# Patient Record
Sex: Female | Born: 1975 | Race: White | Hispanic: No | Marital: Married | Smoking: Former smoker
Health system: Southern US, Community
[De-identification: ages and names within clinical notes are randomized; demographics above are authoritative.]

## PROBLEM LIST (undated history)

## (undated) DIAGNOSIS — E079 Disorder of thyroid, unspecified: Secondary | ICD-10-CM

## (undated) HISTORY — PX: EYE SURGERY: SHX253

## (undated) HISTORY — PX: BREAST BIOPSY: SHX20

---

## 2009-08-29 ENCOUNTER — Ambulatory Visit: Payer: Self-pay | Admitting: Sports Medicine

## 2009-08-29 DIAGNOSIS — R109 Unspecified abdominal pain: Secondary | ICD-10-CM

## 2009-08-29 DIAGNOSIS — M25569 Pain in unspecified knee: Secondary | ICD-10-CM

## 2009-08-29 DIAGNOSIS — R269 Unspecified abnormalities of gait and mobility: Secondary | ICD-10-CM

## 2010-03-05 ENCOUNTER — Ambulatory Visit: Payer: Self-pay | Admitting: Sports Medicine

## 2010-03-05 DIAGNOSIS — M25559 Pain in unspecified hip: Secondary | ICD-10-CM | POA: Insufficient documentation

## 2010-09-18 NOTE — Assessment & Plan Note (Signed)
Summary: RUNNER/GROIN PAIN/MJD   Vital Signs:  Patient profile:   36 year old female BP sitting:   122 / 87  Vitals Entered By: Lillia Pauls CMA (March 05, 2010 11:15 AM)  History of Present Illness: 2 mos ago getting out of car felt pop in LEFT groin since then hurts at times and did a second time w severe pain getting out of bed at that time had some limping took a 2 wk break  running less distance at about 12 min pace spin class OK 6 mi run yesterday slow/ no pain during run/ groin pain worsened p applying ice  last yr had some OP type pain at end of marathon training this type of pain has come back  Allergies: No Known Drug Allergies  Physical Exam  General:  obese alert.   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

## 2010-09-18 NOTE — Assessment & Plan Note (Signed)
Summary: KNEE PROBLEMS WHILE RUNNING,[facility]   Vital Signs:  Patient profile:   35 year old female Height:      65 inches Weight:      165 pounds BMI:     27.56 Temp:     97.8 degrees F Pulse rate:   87 / minute BP sitting:   133 / 87  Vitals Entered By: Lillia Pauls CMA (August 29, 2009 10:07 AM)  History of Present Illness: Pt presents with both pubic pain and left knee pain. She trained for the [employer] marathon which she completed in [place] of 2010. She noticed that when she started to do her long runs that she would have internal pelvic pain. She does not have pain with coughing or sneezing. She denies any prior trauma, surgery or births. She has pain with walking and getting out of the car the day after her long run. She has not tried icing, heat or medications for this. She is able to continue her normal activities without any significant hindrance from her pelvic pain.    She has also started to recently have left anterior knee pain. She describes a feeling in which she wishes that the knee would simply pop to relieve the pressure. The knee pain develops after about 2-3 miles of running. It is worse with going down hills but not painful going up hills. A few hours after she stops her run the pain goes away completely. When she rests she does not have any knee pain. Again, no history of trauma or prior surgeries. The knee does not hurt her as much if she runs on the treadmill.   Allergies (verified): No Known Drug Allergies  Physical Exam  General:  alert and well-developed.   Head:  normocephalic and atraumatic.   Msk:  Left Knee: No bony abnormalities, edema or bruising. Full flexion and extension without pain Laterall patellar tracking No pain along the joint lines or in the popliteal fossa Normal MCL and LCL Negative McMurray's, anterior drawer sign and posterior drawer sign 5/5 strength with resisted knee flexion and extension while seated small VMO  Right Knee: No  bony abnormalities, edema or bruising. Full flexion and extension without pain Laterall patellar tracking No pain along the joint lines or in the popliteal fossa Normal MCL and LCL Negative McMurray's, anterior drawer sign and posterior drawer sign 5/5 strength with resisted knee flexion and extension while seated small VMO  Hips: Weak 4/5 hip flexion and hip abduction against resistance  Gait: Pronates more with her right than her left with walking. However, she has more pronation with jogging in the left foot than in the right foot. 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 Not as significant pes planus of the right foot. She has developing bunions bilaterally.     Impression & Recommendations:  Problem # 1:  KNEE PAIN, LEFT (ICD-719.46) Assessment New  Due to core weakness, pronation during running and lateral tracking patella. 1. Given core strengthening exercises to do daily. 2. Can continue current training but if pain becomes severe will have to modify activities. 3. Given sports insoles today to help correct pes planus and genus valgum. 4. Return in 3-4 weels for follow-up.   Orders: Sports Insoles [phone])  Problem # 2:  PELVIC  PAIN (ICD-789.09) Assessment: New Appears to be due to weakness of hip stabilizers. 1. Given a handout with hip strengthening exercises to do daily. 2. Can take OTC NSAID as  desired.  Problem # 3:  GAIT DISTURBANCE (ICD-781.2) Assessment: New  Due to pes planus and pronation. 1. Given sports insoles with better arch support. She is to use these especially with running and if she is going to be on her feet for long periods of time. 2. Consider custom orthotics in the future as she is also developing bilateral bunions and has a gait disturbance.  Orders: Sports Insoles [phone])

## 2012-12-31 ENCOUNTER — Emergency Department
Admission: EM | Admit: 2012-12-31 | Discharge: 2012-12-31 | Disposition: A | Discharge status: Home / Self Care | Payer: 59 | Source: Home / Self Care | Attending: Family Medicine | Admitting: Family Medicine

## 2012-12-31 ENCOUNTER — Encounter: Payer: Self-pay | Admitting: *Deleted

## 2012-12-31 DIAGNOSIS — N3000 Acute cystitis without hematuria: Secondary | ICD-10-CM

## 2012-12-31 DIAGNOSIS — R358 Other polyuria: Secondary | ICD-10-CM

## 2012-12-31 HISTORY — DX: Disorder of thyroid, unspecified: E07.9

## 2012-12-31 LAB — POCT URINALYSIS DIP (MANUAL ENTRY)
Glucose, UA: NEGATIVE
Ketones, POC UA: NEGATIVE
Spec Grav, UA: 1.02 (ref 1.005–1.03)
Urobilinogen, UA: 0.2 (ref 0–1)

## 2012-12-31 MED ORDER — NITROFURANTOIN MONOHYD MACRO 100 MG PO CAPS: 100.0000 mg | ORAL_CAPSULE | Freq: Two times a day (BID) | ORAL | Status: AC

## 2012-12-31 MED ORDER — FLUCONAZOLE 150 MG PO TABS: 150.0000 mg | ORAL_TABLET | Freq: Once | ORAL | Status: AC

## 2012-12-31 NOTE — ED Provider Notes (Signed)
History     CSN: [id number]  Arrival date & time 12/31/12  1513   First MD Initiated Contact with Patient 12/31/12 1542      Chief Complaint  Patient presents with  . Polyuria       HPI Comments: Patient noticed malodorous urine yesterday.  Later in the day she developed mild urgency that has persisted.  She noticed a brief pink discoloration in her urine today.  Patient's last menstrual period was 12/23/2012.  No fevers, chills, and sweats.  No abdominal or pelvic pain.  Patient is a 37 y.o. female presenting with dysuria. The history is provided by the patient.  Dysuria  This is a new problem. The current episode started yesterday. The problem occurs intermittently. The problem has been gradually worsening. The quality of the pain is described as burning. The patient is experiencing no pain. There has been no fever. Associated symptoms include frequency, hesitancy and urgency. Pertinent negatives include no chills, no sweats, no nausea, no vomiting, no discharge, no hematuria and no flank pain. She has tried nothing for the symptoms.    Past Medical History  Diagnosis Date  . Thyroid disease     Past Surgical History  Procedure Laterality Date  . Eye surgery      Family History  Problem Relation Age of Onset  . Hyperlipidemia Mother   . Hyperlipidemia Father     History  Substance Use Topics  . Smoking status: Former Smoker    Quit date: 12/31/2000  . Smokeless tobacco: Never Used  . Alcohol Use: Yes    OB History   Grav Para Term Preterm Abortions TAB SAB Ect Mult Living                  Review of Systems  Constitutional: Negative for chills.  Gastrointestinal: Negative for nausea and vomiting.  Genitourinary: Positive for dysuria, hesitancy, urgency and frequency. Negative for hematuria and flank pain.  All other systems reviewed and are negative.    Allergies  Bee venom  Home Medications   Current Outpatient Rx  Name  Route  Sig  Dispense  Refill   . levothyroxine (SYNTHROID, LEVOTHROID) 50 MCG tablet   Oral   Take 50 mcg by mouth daily before breakfast.         . nitrofurantoin, macrocrystal-monohydrate, (MACROBID) 100 MG capsule   Oral   Take 1 capsule (100 mg total) by mouth 2 (two) times daily.   14 capsule   0     BP 129/81  Pulse 79  Temp(Src) 98.5 F (36.9 C) (Oral)  Resp 14  Ht 5\' 5"  (1.651 m)  Wt 223 lb (101.152 kg)  BMI 37.11 kg/m2  SpO2 100%  LMP 12/23/2012  Physical Exam Nursing notes and Vital Signs reviewed. Appearance:  Patient appears stated age, and in no acute distress.  Patient is obese (BMI 37.1) Eyes:  Pupils are equal, round, and reactive to light and accomodation.  Extraocular movement is intact.  Conjunctivae are not inflamed  Pharynx:  Normal Neck:  Supple.  No adenopathy Lungs:  Clear to auscultation.  Breath sounds are equal.  Heart:  Regular rate and rhythm without murmurs, rubs, or gallops.  Abdomen:  Mild suprapubic tenderness without masses or hepatosplenomegaly.  Bowel sounds are present.  No CVA or flank tenderness.  Extremities:  No edema.  No calf tenderness Skin:  No rash present.   ED Course  Procedures  none  Labs Reviewed  URINE CULTURE pending  POCT  URINALYSIS DIP (MANUAL ENTRY) BLO small; LEU small      1. Polyuria   2. Acute cystitis       MDM  Urine culture pending.  Begin Macrobid.  Rx written for Diflucan if patient develops candida vaginitis. Increase fluid intake.  May take AZO for 2 days for discomfort, if desired. Followup with Family Doctor if not improved in one week.         Lattie Haw, MD 12/31/12 [phone]

## 2012-12-31 NOTE — ED Notes (Signed)
Olivia Padilla c/o 2 days of polyuria, malodorous urine. This am she reports hematuria. Denis fever or chills.

## 2013-01-01 ENCOUNTER — Telehealth: Payer: Self-pay | Admitting: Emergency Medicine

## 2013-01-02 LAB — URINE CULTURE: Colony Count: >=100000

## 2013-01-03 ENCOUNTER — Telehealth: Payer: Self-pay | Admitting: *Deleted

## 2014-06-02 ENCOUNTER — Other Ambulatory Visit: Payer: Self-pay | Admitting: Internal Medicine

## 2014-06-02 DIAGNOSIS — Z1231 Encounter for screening mammogram for malignant neoplasm of breast: Secondary | ICD-10-CM

## 2014-06-28 ENCOUNTER — Ambulatory Visit
Admission: RE | Admit: 2014-06-28 | Discharge: 2014-06-28 | Disposition: A | Discharge status: Home / Self Care | Payer: 59 | Source: Ambulatory Visit | Attending: Internal Medicine | Admitting: Internal Medicine

## 2014-06-28 DIAGNOSIS — Z1231 Encounter for screening mammogram for malignant neoplasm of breast: Secondary | ICD-10-CM

## 2014-09-18 ENCOUNTER — Encounter (HOSPITAL_COMMUNITY): Payer: Self-pay | Admitting: *Deleted

## 2014-09-18 ENCOUNTER — Emergency Department (HOSPITAL_COMMUNITY)
Admission: EM | Admit: 2014-09-18 | Discharge: 2014-09-18 | Disposition: A | Discharge status: Home / Self Care | Payer: 59 | Source: Home / Self Care | Attending: Family Medicine | Admitting: Family Medicine

## 2014-09-18 DIAGNOSIS — J069 Acute upper respiratory infection, unspecified: Secondary | ICD-10-CM

## 2014-09-18 LAB — POCT RAPID STREP A: Streptococcus, Group A Screen (Direct): NEGATIVE

## 2014-09-18 LAB — POCT INFECTIOUS MONO SCREEN: Mono Screen: NEGATIVE

## 2014-09-18 MED ORDER — IPRATROPIUM BROMIDE 0.06 % NA SOLN: 2.0000 | Freq: Four times a day (QID) | NASAL | Status: AC

## 2014-09-18 NOTE — ED Notes (Signed)
Pt  Has  6  Days  Of    Uri          With  sorethroat   Congested       Earache     And      Drainage      Pt  Saw  Her PCP   sev  Days

## 2014-09-18 NOTE — ED Provider Notes (Signed)
CSN: [id number]     Arrival date & time 09/18/14  0941 History   First MD Initiated Contact with Patient 09/18/14 1011     Chief Complaint  Patient presents with  . URI   (Consider location/radiation/quality/duration/timing/severity/associated sxs/prior Treatment) Patient is a 39 y.o. female presenting with URI. The history is provided by the patient.  URI Presenting symptoms: congestion, cough, fatigue, rhinorrhea and sore throat   Presenting symptoms: no fever   Severity:  Mild Onset quality:  Gradual Duration:  6 days Progression:  Worsening Chronicity:  New (seen by lmd with neg strep and flu test,) Relieved by:  None tried Worsened by:  Nothing tried Ineffective treatments:  None tried Associated symptoms: swollen glands   Risk factors: no sick contacts     Past Medical History  Diagnosis Date  . Thyroid disease    Past Surgical History  Procedure Laterality Date  . Eye surgery     Family History  Problem Relation Age of Onset  . Hyperlipidemia Mother   . Hyperlipidemia Father    History  Substance Use Topics  . Smoking status: Former Smoker    Quit date: 12/31/2000  . Smokeless tobacco: Never Used  . Alcohol Use: Yes   OB History    No data available     Review of Systems  Constitutional: Positive for appetite change and fatigue. Negative for fever.  HENT: Positive for congestion, rhinorrhea and sore throat.   Respiratory: Positive for cough.   Gastrointestinal: Negative.   Genitourinary: Negative.   Skin: Negative for rash.    Allergies  Bee venom  Home Medications   Prior to Admission medications   Medication Sig Start Date End Date Taking? Authorizing Provider  fluconazole (DIFLUCAN) 150 MG tablet Take 1 tablet (150 mg total) by mouth once. Take for symptoms of vaginal yeast infection 12/31/12   Lattie HawStephen A Beese, MD  ipratropium (ATROVENT) 0.06 % nasal spray Place 2 sprays into both nostrils 4 (four) times daily. 09/18/14   Linna HoffJames D Erby Sanderson, MD   levothyroxine (SYNTHROID, LEVOTHROID) 50 MCG tablet Take 50 mcg by mouth daily before breakfast.    Historical Provider, MD  nitrofurantoin, macrocrystal-monohydrate, (MACROBID) 100 MG capsule Take 1 capsule (100 mg total) by mouth 2 (two) times daily. 12/31/12   Lattie HawStephen A Beese, MD   BP 123/79 mmHg  Pulse 75  Temp(Src) 99 F (37.2 C) (Oral)  Resp 20  SpO2 100%  LMP 09/11/2014 Physical Exam  Constitutional: She is oriented to person, place, and time. She appears well-developed and well-nourished.  HENT:  Right Ear: External ear normal.  Left Ear: External ear normal.  Mouth/Throat: Oropharyngeal exudate present.  Eyes: Conjunctivae are normal. Pupils are equal, round, and reactive to light.  Neck: Normal range of motion. Neck supple.  Cardiovascular: Normal heart sounds.   Pulmonary/Chest: Effort normal and breath sounds normal.  Lymphadenopathy:    She has cervical adenopathy.  Neurological: She is alert and oriented to person, place, and time.  Skin: Skin is warm and dry.  Nursing note and vitals reviewed.   ED Course  Procedures (including critical care time) Labs Review Labs Reviewed  POCT RAPID STREP A ([facility]RG CARE ONLY)  POCT INFECTIOUS MONO SCREEN   Strep and mono both neg. Imaging Review No results found.   MDM   1. URI (upper respiratory infection)        Linna HoffJames D Jebadiah Imperato, MD 09/18/14 1037

## 2014-09-18 NOTE — Discharge Instructions (Signed)
Drink plenty of fluids as discussed, use medicine as prescribed, and mucinex or delsym for cough. Return or see your doctor if further problems °

## 2014-09-20 LAB — CULTURE, GROUP A STREP

## 2015-08-21 MED FILL — LEVOTHYROXINE 50 MCG TABLET: 50 | 28 days supply | Qty: 36 | Fill #8

## 2015-09-18 MED FILL — LEVOTHYROXINE 50 MCG TABLET: 50 | 28 days supply | Qty: 36 | Fill #9

## 2015-10-16 MED FILL — LEVOTHYROXINE 50 MCG TABLET: 50 | 90 days supply | Qty: 120 | Fill #0

## 2016-01-16 MED FILL — LEVOTHYROXINE 50 MCG TABLET: 50 | 90 days supply | Qty: 120 | Fill #1

## 2016-04-15 MED FILL — LEVOTHYROXINE 50 MCG TABLET: 50 | 90 days supply | Qty: 120 | Fill #2

## 2016-06-07 DIAGNOSIS — E038 Other specified hypothyroidism: Secondary | ICD-10-CM | POA: Diagnosis not present

## 2016-06-07 DIAGNOSIS — Z0000 Encounter for general adult medical examination without abnormal findings: Secondary | ICD-10-CM | POA: Diagnosis not present

## 2016-06-07 DIAGNOSIS — E559 Vitamin D deficiency, unspecified: Secondary | ICD-10-CM | POA: Diagnosis not present

## 2016-06-14 DIAGNOSIS — E038 Other specified hypothyroidism: Secondary | ICD-10-CM | POA: Diagnosis not present

## 2016-06-14 DIAGNOSIS — Z0000 Encounter for general adult medical examination without abnormal findings: Secondary | ICD-10-CM | POA: Diagnosis not present

## 2016-06-14 DIAGNOSIS — E559 Vitamin D deficiency, unspecified: Secondary | ICD-10-CM | POA: Diagnosis not present

## 2016-06-14 DIAGNOSIS — Z1389 Encounter for screening for other disorder: Secondary | ICD-10-CM | POA: Diagnosis not present

## 2016-06-14 DIAGNOSIS — Z6826 Body mass index (BMI) 26.0-26.9, adult: Secondary | ICD-10-CM | POA: Diagnosis not present

## 2016-07-22 MED FILL — LEVOTHYROXINE 50 MCG TABLET: 50 | 90 days supply | Qty: 120 | Fill #0

## 2016-08-09 DIAGNOSIS — M84361A Stress fracture, right tibia, initial encounter for fracture: Secondary | ICD-10-CM | POA: Diagnosis not present

## 2016-10-21 MED FILL — LEVOTHYROXINE 50 MCG TABLET: 50 | 90 days supply | Qty: 120 | Fill #1

## 2017-01-16 MED FILL — LEVOTHYROXINE 50 MCG TABLET: 50 | 90 days supply | Qty: 120 | Fill #2

## 2017-04-17 MED FILL — LEVOTHYROXINE 50 MCG TABLET: 50 | 90 days supply | Qty: 120 | Fill #0

## 2017-06-03 DIAGNOSIS — H5213 Myopia, bilateral: Secondary | ICD-10-CM | POA: Diagnosis not present

## 2017-06-17 DIAGNOSIS — Z0000 Encounter for general adult medical examination without abnormal findings: Secondary | ICD-10-CM | POA: Diagnosis not present

## 2017-06-17 DIAGNOSIS — E559 Vitamin D deficiency, unspecified: Secondary | ICD-10-CM | POA: Diagnosis not present

## 2017-06-17 DIAGNOSIS — E038 Other specified hypothyroidism: Secondary | ICD-10-CM | POA: Diagnosis not present

## 2017-06-17 DIAGNOSIS — R82998 Other abnormal findings in urine: Secondary | ICD-10-CM | POA: Diagnosis not present

## 2017-06-24 ENCOUNTER — Other Ambulatory Visit: Payer: Self-pay | Admitting: Internal Medicine

## 2017-06-24 DIAGNOSIS — Z1231 Encounter for screening mammogram for malignant neoplasm of breast: Secondary | ICD-10-CM

## 2017-06-24 DIAGNOSIS — Z1389 Encounter for screening for other disorder: Secondary | ICD-10-CM | POA: Diagnosis not present

## 2017-06-24 DIAGNOSIS — E559 Vitamin D deficiency, unspecified: Secondary | ICD-10-CM | POA: Diagnosis not present

## 2017-06-24 DIAGNOSIS — E038 Other specified hypothyroidism: Secondary | ICD-10-CM | POA: Diagnosis not present

## 2017-06-24 DIAGNOSIS — Z0000 Encounter for general adult medical examination without abnormal findings: Secondary | ICD-10-CM | POA: Diagnosis not present

## 2017-06-24 DIAGNOSIS — Z6827 Body mass index (BMI) 27.0-27.9, adult: Secondary | ICD-10-CM | POA: Diagnosis not present

## 2017-07-17 ENCOUNTER — Ambulatory Visit
Admission: RE | Admit: 2017-07-17 | Discharge: 2017-07-17 | Disposition: A | Discharge status: Home / Self Care | Payer: 59 | Source: Ambulatory Visit | Attending: Internal Medicine | Admitting: Internal Medicine

## 2017-07-17 DIAGNOSIS — Z1231 Encounter for screening mammogram for malignant neoplasm of breast: Secondary | ICD-10-CM

## 2017-07-21 ENCOUNTER — Other Ambulatory Visit: Payer: Self-pay | Admitting: Internal Medicine

## 2017-07-21 DIAGNOSIS — R928 Other abnormal and inconclusive findings on diagnostic imaging of breast: Secondary | ICD-10-CM

## 2017-07-25 ENCOUNTER — Ambulatory Visit
Admission: RE | Admit: 2017-07-25 | Discharge: 2017-07-25 | Disposition: A | Discharge status: Home / Self Care | Payer: 59 | Source: Ambulatory Visit | Attending: Internal Medicine | Admitting: Internal Medicine

## 2017-07-25 ENCOUNTER — Ambulatory Visit: Admission: RE | Admit: 2017-07-25 | Payer: 59 | Source: Ambulatory Visit

## 2017-07-25 DIAGNOSIS — R928 Other abnormal and inconclusive findings on diagnostic imaging of breast: Secondary | ICD-10-CM

## 2017-07-25 DIAGNOSIS — R922 Inconclusive mammogram: Secondary | ICD-10-CM | POA: Diagnosis not present

## 2017-07-25 MED FILL — LEVOTHYROXINE 50 MCG TABLET: 50 | 90 days supply | Qty: 120 | Fill #1

## 2017-09-05 DIAGNOSIS — S63641A Sprain of metacarpophalangeal joint of right thumb, initial encounter: Secondary | ICD-10-CM | POA: Diagnosis not present

## 2017-09-05 DIAGNOSIS — M25531 Pain in right wrist: Secondary | ICD-10-CM | POA: Diagnosis not present

## 2017-09-09 ENCOUNTER — Other Ambulatory Visit (HOSPITAL_COMMUNITY): Payer: Self-pay | Admitting: Orthopedic Surgery

## 2017-09-09 DIAGNOSIS — S63641A Sprain of metacarpophalangeal joint of right thumb, initial encounter: Secondary | ICD-10-CM

## 2017-09-19 ENCOUNTER — Ambulatory Visit (HOSPITAL_COMMUNITY)
Admission: RE | Admit: 2017-09-19 | Discharge: 2017-09-19 | Disposition: A | Discharge status: Home / Self Care | Payer: 59 | Source: Ambulatory Visit | Attending: Orthopedic Surgery | Admitting: Orthopedic Surgery

## 2017-09-19 DIAGNOSIS — S63641A Sprain of metacarpophalangeal joint of right thumb, initial encounter: Secondary | ICD-10-CM

## 2017-09-19 DIAGNOSIS — X58XXXA Exposure to other specified factors, initial encounter: Secondary | ICD-10-CM | POA: Insufficient documentation

## 2017-09-19 DIAGNOSIS — M79641 Pain in right hand: Secondary | ICD-10-CM | POA: Diagnosis not present

## 2017-09-19 DIAGNOSIS — S6991XA Unspecified injury of right wrist, hand and finger(s), initial encounter: Secondary | ICD-10-CM | POA: Diagnosis not present

## 2017-09-19 DIAGNOSIS — M79644 Pain in right finger(s): Secondary | ICD-10-CM | POA: Diagnosis not present

## 2017-09-19 MED ORDER — GADOBENATE DIMEGLUMINE 529 MG/ML IV SOLN
5.0000 mL | Freq: Once | INTRAVENOUS | Status: AC | PRN
  Administered 2017-09-19: 0.5 mL via INTRA_ARTICULAR

## 2017-09-19 MED ORDER — IOPAMIDOL (ISOVUE-M 200) INJECTION 41%
20.0000 mL | Freq: Once | INTRAMUSCULAR | Status: AC
  Administered 2017-09-19: 20 mL via INTRA_ARTICULAR

## 2017-09-19 MED ORDER — LIDOCAINE HCL (PF) 1 % IJ SOLN
10.0000 mL | Freq: Once | INTRAMUSCULAR | Status: AC
  Administered 2017-09-19: 10 mL

## 2017-09-26 DIAGNOSIS — S63641A Sprain of metacarpophalangeal joint of right thumb, initial encounter: Secondary | ICD-10-CM | POA: Diagnosis not present

## 2017-10-17 DIAGNOSIS — S63641D Sprain of metacarpophalangeal joint of right thumb, subsequent encounter: Secondary | ICD-10-CM | POA: Diagnosis not present

## 2017-11-07 DIAGNOSIS — S63641D Sprain of metacarpophalangeal joint of right thumb, subsequent encounter: Secondary | ICD-10-CM | POA: Diagnosis not present

## 2017-12-13 ENCOUNTER — Other Ambulatory Visit: Payer: Self-pay

## 2017-12-13 ENCOUNTER — Emergency Department (INDEPENDENT_AMBULATORY_CARE_PROVIDER_SITE_OTHER): Payer: 59

## 2017-12-13 ENCOUNTER — Encounter: Payer: Self-pay | Admitting: Emergency Medicine

## 2017-12-13 ENCOUNTER — Emergency Department (INDEPENDENT_AMBULATORY_CARE_PROVIDER_SITE_OTHER)
Admission: EM | Admit: 2017-12-13 | Discharge: 2017-12-13 | Disposition: A | Discharge status: Home / Self Care | Payer: 59 | Source: Home / Self Care | Attending: Family Medicine | Admitting: Family Medicine

## 2017-12-13 DIAGNOSIS — R0781 Pleurodynia: Secondary | ICD-10-CM

## 2017-12-13 DIAGNOSIS — M546 Pain in thoracic spine: Secondary | ICD-10-CM | POA: Diagnosis not present

## 2017-12-13 DIAGNOSIS — W19XXXA Unspecified fall, initial encounter: Secondary | ICD-10-CM

## 2017-12-13 DIAGNOSIS — S2232XA Fracture of one rib, left side, initial encounter for closed fracture: Secondary | ICD-10-CM | POA: Diagnosis not present

## 2017-12-13 DIAGNOSIS — Y9302 Activity, running: Secondary | ICD-10-CM | POA: Diagnosis not present

## 2017-12-13 MED ORDER — TRAMADOL HCL 50 MG PO TABS: 50.0000 mg | ORAL_TABLET | Freq: Four times a day (QID) | ORAL | 0 refills | Status: AC | PRN

## 2017-12-13 MED ORDER — CYCLOBENZAPRINE HCL 10 MG PO TABS: 10.0000 mg | ORAL_TABLET | Freq: Two times a day (BID) | ORAL | 0 refills | Status: DC | PRN

## 2017-12-13 NOTE — Discharge Instructions (Addendum)
°  Tramadol is strong pain medication. While taking, do not drink alcohol, drive, or perform any other activities that requires focus while taking these medications.   Flexeril (cyclobenzaprine) is a muscle relaxer and may cause drowsiness. Do not drink alcohol, drive, or operate heavy machinery while taking.  You may take  acetaminophen every 4-6 hours or in combination with ibuprofen 400-600mg  every 6-8 hours as needed for pain, inflammation, and fever.  Be sure to drink at least eight 8oz glasses of water to stay well hydrated and get at least 8 hours of sleep at night, preferably more while sick.

## 2017-12-13 NOTE — ED Triage Notes (Signed)
Patient was running race today and tripped and fell hard onto left side; painful when any movement. Took ibuprofen  at 1630.

## 2017-12-13 NOTE — ED Provider Notes (Signed)
[facility]    CSN: [id number] Arrival date & time: 12/13/17  1711     History   Chief Complaint Chief Complaint  Patient presents with  . Chest Pain    HPI Olivia Padilla is a 42 y.o. female.   HPI Olivia Padilla is a 42 y.o. female presenting to UC with c/o sudden onset Left side rib pain after she feel hard onto the ground during a trail race this morning.  She took two Advil around 10:30AM so she could finish the race and another  at 16:30 with mild relief.  Pain is worse with deep breath, movement and palpation. Pain is moderate to severe, aching and sharp.  No prior hx of rib fractures.  She reports a scrape to her knee but is not concerned about that.  Denies hitting her head. Denies neck or back pain.   Past Medical History:  Diagnosis Date  . Thyroid disease     Patient Active Problem List   Diagnosis Date Noted  . HIP PAIN, LEFT 03/05/2010  . KNEE PAIN, LEFT 08/29/2009  . GAIT DISTURBANCE 08/29/2009  . PELVIC  PAIN 08/29/2009    Past Surgical History:  Procedure Laterality Date  . BREAST BIOPSY    . EYE SURGERY      OB History   None      Home Medications    Prior to Admission medications   Medication Sig Start Date End Date Taking? Authorizing Provider  cyclobenzaprine (FLEXERIL) 10 MG tablet Take 1 tablet (10 mg total) by mouth 2 (two) times daily as needed. 12/13/17   Lurene Shadow, PA-C  fluconazole (DIFLUCAN) 150 MG tablet Take 1 tablet (150 mg total) by mouth once. Take for symptoms of vaginal yeast infection 12/31/12   Lattie Haw, MD  ipratropium (ATROVENT) 0.06 % nasal spray Place 2 sprays into both nostrils 4 (four) times daily. 09/18/14   Linna Hoff, MD  levothyroxine (SYNTHROID, LEVOTHROID) 50 MCG tablet Take 50 mcg by mouth daily before breakfast.    [provider]  nitrofurantoin, macrocrystal-monohydrate, (MACROBID) 100 MG capsule Take 1 capsule (100 mg total) by mouth 2 (two) times daily. 12/31/12    Lattie Haw, MD  traMADol (ULTRAM) 50 MG tablet Take 1 tablet (50 mg total) by mouth every 6 (six) hours as needed. 12/13/17   Lurene Shadow, PA-C    Family History Family History  Problem Relation Age of Onset  . Hyperlipidemia Mother   . Hyperlipidemia Father   . Breast cancer Paternal Grandmother     Social History Social History   Tobacco Use  . Smoking status: Former Smoker    Last attempt to quit: 12/31/2000    Years since quitting: 16.9  . Smokeless tobacco: Never Used  Substance Use Topics  . Alcohol use: Yes  . Drug use: No     Allergies   Bee venom   Review of Systems Review of Systems  Cardiovascular: Positive for chest pain (Left ribs). Negative for palpitations.  Musculoskeletal: Negative for arthralgias, back pain and myalgias.     Physical Exam Triage Vital Signs ED Triage Vitals  Enc Vitals Group     BP 12/13/17 1723 127/86     Pulse Rate 12/13/17 1723 69     Resp 12/13/17 1723 18     Temp 12/13/17 1723 98.4 F (36.9 C)     Temp Source 12/13/17 1723 Oral     SpO2 12/13/17 1723 99 %  Weight 12/13/17 1724 149 lb (67.6 kg)     Height 12/13/17 1724  (1.626 m)     Head Circumference --      Peak Flow --      Pain Score 12/13/17 1724 8     Pain Loc --      Pain Edu? --      Excl. in GC? --    No data found.  Updated Vital Signs BP 127/86 (BP Location: Right Arm)   Pulse 69   Temp 98.4 F (36.9 C) (Oral)   Resp 18   Ht  (1.626 m)   Wt 149 lb (67.6 kg)   LMP 11/25/2017 (Exact Date)   SpO2 99%   BMI 25.58 kg/m   Visual Acuity Right Eye Distance:   Left Eye Distance:   Bilateral Distance:    Right Eye Near:   Left Eye Near:    Bilateral Near:     Physical Exam  Constitutional: She is oriented to person, place, and time. She appears well-developed and well-nourished.  HENT:  Head: Normocephalic and atraumatic.  Eyes: EOM are normal.  Neck: Normal range of motion.  Cardiovascular: Normal rate.    Pulmonary/Chest: Effort normal. She exhibits tenderness.  Tenderness to Left side ribs, midaxial. No crepitus or deformity.     Musculoskeletal: Normal range of motion.  No midline spinal tenderness. Tenderness to Left mid thoracic muscles. No deformity.  Neurological: She is alert and oriented to person, place, and time.  Skin: Skin is warm and dry.  Chest wall: faint ecchymosis to Left side chest wall over tender ribs. Skin in tact  Psychiatric: She has a normal mood and affect. Her behavior is normal.  Nursing note and vitals reviewed.    UC Treatments / Results  Labs (all labs ordered are listed, but only abnormal results are displayed) Labs Reviewed - No data to display  EKG None Radiology Dg Ribs Unilateral W/chest Left  Result Date: 12/13/2017 CLINICAL DATA:  Patient fell while running injuring left ribs. Pain with movement and inspiration. EXAM: LEFT RIBS AND CHEST - 3+ VIEW COMPARISON:  None. FINDINGS: Acute nondisplaced left anterior eleventh rib fracture is identified. Lungs are clear without pneumothorax or contusion. No effusion. Heart and mediastinal contours are normal. IMPRESSION: Acute nondisplaced left eleventh rib fracture. Electronically Signed   By: Tollie Eth M.D.   On: 12/13/2017 17:57    Procedures Procedures (including critical care time)  Medications Ordered in UC Medications - No data to display   Initial Impression / Assessment and Plan / UC Course  I have reviewed the triage vital signs and the nursing notes.  Pertinent labs & imaging results that were available during my care of the patient were reviewed by me and considered in my medical decision making (see chart for details).     CXR c/w nondisplaced 11th rib fracture Rib belt applied for comfort Home care instructions provided F/u with PCP in 1 week as needed.   Final Clinical Impressions(s) / UC Diagnoses   Final diagnoses:  Rib pain on left side    ED Discharge Orders         Ordered    cyclobenzaprine (FLEXERIL) 10 MG tablet  2 times daily PRN     12/13/17 1759    traMADol (ULTRAM) 50 MG tablet  Every 6 hours PRN     12/13/17 1759       Controlled Substance Prescriptions [place] Controlled Substance Registry consulted? Yes, I have consulted  the [place] Controlled Substances Registry for this patient, and feel the risk/benefit ratio today is favorable for proceeding with this prescription for a controlled substance.   Lurene Shadow, [place] 12/13/17 1806

## 2017-12-19 ENCOUNTER — Telehealth: Payer: Self-pay

## 2017-12-19 ENCOUNTER — Telehealth: Payer: Self-pay | Admitting: Family Medicine

## 2017-12-19 ENCOUNTER — Telehealth: Payer: 59 | Admitting: Family

## 2017-12-19 DIAGNOSIS — M545 Low back pain, unspecified: Secondary | ICD-10-CM

## 2017-12-19 MED ORDER — PREDNISONE 5 MG PO TABS: 5.0000 mg | ORAL_TABLET | ORAL | 0 refills | Status: AC

## 2017-12-19 MED ORDER — CYCLOBENZAPRINE HCL 10 MG PO TABS: 10.0000 mg | ORAL_TABLET | Freq: Three times a day (TID) | ORAL | 0 refills | Status: AC | PRN

## 2017-12-19 NOTE — Telephone Encounter (Signed)
Pt informed tramadol script up is up front. Told to show photo ID upon pickup.

## 2017-12-19 NOTE — Telephone Encounter (Signed)
Refill tramadol  one Q6hr prn #20, no refill

## 2017-12-19 NOTE — Progress Notes (Signed)
Thank you for the details you included in the comment boxes. Those details are very helpful in determining the best course of treatment for you and help [place] to provide the best care. Per [facility] policy, we are unable to give narcotic meds through the E-visit program. Also, as you were treated by a [facility] facility, it is best to reach out to the [facility] (they are open now) and ask them to call in a refill. They are the original prescriber and are able to do that at their discretion. I can refill the Flexeril for sure along with a prednisone pack which will help a lot with the pain/inflammation.   We are sorry that you are not feeling well.  Here is how we plan to help!  Based on what you have shared with me it looks like you mostly have acute back pain.  Acute back pain is defined as musculoskeletal pain that can resolve in 1-3 weeks with conservative treatment.  *See notes above. I am sending a prednisone dose pack along with Flexeril  every 8 hours as needed for muscle spasm.   Some patients experience stomach irritation or in increased heartburn with anti-inflammatory drugs.  Please keep in mind that muscle relaxer's can cause fatigue and should not be taken while at work or driving.  Back pain is very common.  The pain often gets better over time.  The cause of back pain is usually not dangerous.  Most people can learn to manage their back pain on their own.  Home Care  Stay active.  Start with short walks on flat ground if you can.  Try to walk farther each day.  Do not sit, drive or stand in one place for more than 30 minutes.  Do not stay in bed.  Do not avoid exercise or work.  Activity can help your back heal faster.  Be careful when you bend or lift an object.  Bend at your knees, keep the object close to you, and do not twist.  Sleep on a firm mattress.  Lie on your side, and bend your knees.  If you lie on your back, put a pillow under your knees.  Only take medicines as  told by your doctor.  Put ice on the injured area.  Put ice in a plastic bag  Place a towel between your skin and the bag  Leave the ice on for 15-20 minutes, 3-4 times a day for the first 2-3 days. 210 After that, you can switch between ice and heat packs.  Ask your doctor about back exercises or massage.  Avoid feeling anxious or stressed.  Find good ways to deal with stress, such as exercise.  Get Help Right Way If:  Your pain does not go away with rest or medicine.  Your pain does not go away in 1 week.  You have new problems.  You do not feel well.  The pain spreads into your legs.  You cannot control when you poop (bowel movement) or pee (urinate)  You feel sick to your stomach (nauseous) or throw up (vomit)  You have belly (abdominal) pain.  You feel like you may pass out (faint).  If you develop a fever.  Make Sure you:  Understand these instructions.  Will watch your condition  Will get help right away if you are not doing well or get worse.  Your e-visit answers were reviewed by a board certified advanced clinical practitioner to complete your personal care  plan.  Depending on the condition, your plan could have included both over the counter or prescription medications.  If there is a problem please reply  once you have received a response from your provider.  Your safety is important to [place].  If you have drug allergies check your prescription carefully.    You can use MyChart to ask questions about today's visit, request a non-urgent call back, or ask for a work or school excuse for 24 hours related to this e-Visit. If it has been greater than 24 hours you will need to follow up with your provider, or enter a new e-Visit to address those concerns.  You will get an e-mail in the next two days asking about your experience.  I hope that your e-visit has been valuable and will speed your recovery. Thank you for using e-visits.

## 2018-01-06 DIAGNOSIS — Z3202 Encounter for pregnancy test, result negative: Secondary | ICD-10-CM | POA: Diagnosis not present

## 2018-01-06 DIAGNOSIS — N926 Irregular menstruation, unspecified: Secondary | ICD-10-CM | POA: Diagnosis not present

## 2018-01-06 DIAGNOSIS — N951 Menopausal and female climacteric states: Secondary | ICD-10-CM | POA: Diagnosis not present

## 2018-01-06 DIAGNOSIS — G4700 Insomnia, unspecified: Secondary | ICD-10-CM | POA: Diagnosis not present

## 2018-01-06 DIAGNOSIS — N912 Amenorrhea, unspecified: Secondary | ICD-10-CM | POA: Diagnosis not present

## 2018-01-24 ENCOUNTER — Encounter: Payer: Self-pay | Admitting: Emergency Medicine

## 2018-01-24 ENCOUNTER — Emergency Department (INDEPENDENT_AMBULATORY_CARE_PROVIDER_SITE_OTHER)
Admission: EM | Admit: 2018-01-24 | Discharge: 2018-01-24 | Disposition: A | Discharge status: Home / Self Care | Payer: 59 | Source: Home / Self Care | Attending: Family Medicine | Admitting: Family Medicine

## 2018-01-24 ENCOUNTER — Other Ambulatory Visit: Payer: Self-pay

## 2018-01-24 DIAGNOSIS — H9203 Otalgia, bilateral: Secondary | ICD-10-CM | POA: Diagnosis not present

## 2018-01-24 DIAGNOSIS — J029 Acute pharyngitis, unspecified: Secondary | ICD-10-CM

## 2018-01-24 LAB — POCT RAPID STREP A (OFFICE): RAPID STREP A SCREEN: NEGATIVE

## 2018-01-24 NOTE — ED Provider Notes (Signed)
[facility]    CSN: [id number] Arrival date & time: 01/24/18  4782     History   Chief Complaint Chief Complaint  Patient presents with  . Otalgia    bilateral  . Sore Throat    HPI Olivia Padilla is a 42 y.o. female.   Patient noticed some mild soreness in her hard palate 5 days ago that resolved.  Two days ago she developed sore throat and bilateral earache.  No sinus congestion or cough.  No fevers, chills, and sweats.  She feels well otherwise. She states that she had a cold about 3 weeks ago that resolved completely.  The history is provided by the patient.    Past Medical History:  Diagnosis Date  . Thyroid disease     Patient Active Problem List   Diagnosis Date Noted  . HIP PAIN, LEFT 03/05/2010  . KNEE PAIN, LEFT 08/29/2009  . GAIT DISTURBANCE 08/29/2009  . PELVIC  PAIN 08/29/2009    Past Surgical History:  Procedure Laterality Date  . BREAST BIOPSY    . EYE SURGERY      OB History   None      Home Medications    Prior to Admission medications   Medication Sig Start Date End Date Taking? Authorizing Provider  cyclobenzaprine (FLEXERIL) 10 MG tablet Take 1 tablet (10 mg total) by mouth every 8 (eight) hours as needed for muscle spasms. 12/19/17   Withrow, Everardo All, FNP  fluconazole (DIFLUCAN) 150 MG tablet Take 1 tablet (150 mg total) by mouth once. Take for symptoms of vaginal yeast infection 12/31/12   Lattie Haw, MD  ipratropium (ATROVENT) 0.06 % nasal spray Place 2 sprays into both nostrils 4 (four) times daily. 09/18/14   Linna Hoff, MD  levothyroxine (SYNTHROID, LEVOTHROID) 50 MCG tablet Take 50 mcg by mouth daily before breakfast.    [provider]  nitrofurantoin, macrocrystal-monohydrate, (MACROBID) 100 MG capsule Take 1 capsule (100 mg total) by mouth 2 (two) times daily. 12/31/12   Lattie Haw, MD  predniSONE (DELTASONE) 5 MG tablet Take 1 tablet (5 mg total) by mouth as directed. Taper 6,5,4,3,2,1 12/19/17    Withrow, Everardo All, FNP  traMADol (ULTRAM) 50 MG tablet Take 1 tablet (50 mg total) by mouth every 6 (six) hours as needed. 12/13/17   Lurene Shadow, PA-C    Family History Family History  Problem Relation Age of Onset  . Hyperlipidemia Mother   . Hyperlipidemia Father   . Breast cancer Paternal Grandmother     Social History Social History   Tobacco Use  . Smoking status: Former Smoker    Last attempt to quit: 12/31/2000    Years since quitting: 17.0  . Smokeless tobacco: Never Used  Substance Use Topics  . Alcohol use: Yes  . Drug use: No     Allergies   Bee venom   Review of Systems Review of Systems + sore throat No cough No pleuritic pain No wheezing No nasal congestion ? post-nasal drainage No sinus pain/pressure No itchy/red eyes ? earache No hemoptysis No SOB No fever/chills No nausea No vomiting No abdominal pain No diarrhea No urinary symptoms No skin rash No fatigue No myalgias No headache    Physical Exam Triage Vital Signs ED Triage Vitals  Enc Vitals Group     BP 01/24/18 0943 107/72     Pulse Rate 01/24/18 0943 68     Resp 01/24/18 0943 18  Temp 01/24/18 0943 98.7 F (37.1 C)     Temp Source 01/24/18 0943 Oral     SpO2 01/24/18 0943 99 %     Weight 01/24/18 0944 150 lb (68 kg)     Height 01/24/18 0944 5\' 5"  (1.651 m)     Head Circumference --      Peak Flow --      Pain Score 01/24/18 0944 8     Pain Loc --      Pain Edu? --      Excl. in GC? --    No data found.  Updated Vital Signs BP 107/72 (BP Location: Right Arm)   Pulse 68   Temp 98.7 F (37.1 C) (Oral)   Resp 18   Ht 5\' 5"  (1.651 m)   Wt 150 lb (68 kg)   LMP 11/24/2017 (Exact Date) Comment: went to OB/GYN 2 weeks ago; negative HCG  SpO2 99%   BMI 24.96 kg/m   Visual Acuity Right Eye Distance:   Left Eye Distance:   Bilateral Distance:    Right Eye Near:   Left Eye Near:    Bilateral Near:     Physical Exam Nursing notes and Vital Signs  reviewed. Appearance:  Patient appears stated age, and in no acute distress Eyes:  Pupils are equal, round, and reactive to light and accomodation.  Extraocular movement is intact.  Conjunctivae are not inflamed  Ears:  Canals normal.  Tympanic membranes normal.  No TMJ tenderness Nose:  Mildly congested turbinates.  No sinus tenderness.  Pharynx:  Normal Neck:  Supple.  Enlarged posterior/lateral nodes are palpated bilaterally, tender to palpation on the left.   Lungs:  Clear to auscultation.  Breath sounds are equal.  Moving air well. Heart:  Regular rate and rhythm without murmurs, rubs, or gallops.  Abdomen:  Nontender without masses or hepatosplenomegaly.  Bowel sounds are present.  No CVA or flank tenderness.  Extremities:  No edema.  Skin:  No rash present.    UC Treatments / Results  Labs (all labs ordered are listed, but only abnormal results are displayed) Labs Reviewed  STREP A DNA PROBE  POCT RAPID STREP A (OFFICE) negative   Tympanometry:  Right ear tympanogram normal; Left ear tympanogram normal EKG None  Radiology No results found.  Procedures Procedures (including critical care time)  Medications Ordered in UC Medications - No data to display  Initial Impression / Assessment and Plan / UC Course  I have reviewed the triage vital signs and the nursing notes.  Pertinent labs & imaging results that were available during my care of the patient were reviewed by me and considered in my medical decision making (see chart for details).    There is no evidence of bacterial infection today.  Throat culture pending.  Suspect early viral URI. Followup with Family Doctor if not improved in about 10 days.   Final Clinical Impressions(s) / UC Diagnoses   Final diagnoses:  Otalgia of both ears  Acute pharyngitis, unspecified etiology     Discharge Instructions     Try warm salt water gargles for sore throat.  May take Ibuprofen 200mg , 4 tabs every 8 hours with food  for sore throat pain  If increasing cold symptoms develop, try the following:  Take plain guaifenesin (1200mg  extended release tabs such as Mucinex) twice daily, with plenty of water, for cough and congestion.  May add Pseudoephedrine (30mg , one or two every 4 to 6 hours) for sinus congestion.  Get adequate rest.   May use Afrin nasal spray (or generic oxymetazoline) each morning for about 5 days and then discontinue.  Also recommend using saline nasal spray several times daily and saline nasal irrigation (AYR is a common brand).  Use Flonase nasal spray each morning after using Afrin nasal spray and saline nasal irrigation. May take Delsym Cough Suppressant at bedtime for nighttime cough.  Stop all antihistamines for now, and other non-prescription cough/cold preparations.        ED Prescriptions    None        Lattie Haw, MD 01/26/18 [id number]

## 2018-01-24 NOTE — ED Triage Notes (Signed)
Patient had chest cold mid May; resolved then 2 days ago began having sore throat and now bilateral ear pain; no OTCs this morning.

## 2018-01-24 NOTE — Discharge Instructions (Addendum)
Try warm salt water gargles for sore throat.  May take Ibuprofen 200mg , 4 tabs every 8 hours with food for sore throat pain  If increasing cold symptoms develop, try the following:  Take plain guaifenesin (1200mg  extended release tabs such as Mucinex) twice daily, with plenty of water, for cough and congestion.  May add Pseudoephedrine (30mg , one or two every 4 to 6 hours) for sinus congestion.  Get adequate rest.   May use Afrin nasal spray (or generic oxymetazoline) each morning for about 5 days and then discontinue.  Also recommend using saline nasal spray several times daily and saline nasal irrigation (AYR is a common brand).  Use Flonase nasal spray each morning after using Afrin nasal spray and saline nasal irrigation. May take Delsym Cough Suppressant at bedtime for nighttime cough.  Stop all antihistamines for now, and other non-prescription cough/cold preparations.

## 2018-01-26 ENCOUNTER — Telehealth: Payer: Self-pay | Admitting: *Deleted

## 2018-01-26 LAB — STREP A DNA PROBE: GROUP A STREP PROBE: NOT DETECTED

## 2018-01-26 NOTE — Telephone Encounter (Signed)
Callback: No answer. Left message on patient's personal mobile voicemail throat culture is negative. Please call back as needed.

## 2018-02-04 MED FILL — LO LOESTRIN FE 1-10 TABLET: 1 MG-10 MCG | 28 days supply | Qty: 28 | Fill #0

## 2018-03-05 MED FILL — LO LOESTRIN FE 1-10 TABLET: 1 MG-10 MCG | 28 days supply | Qty: 28 | Fill #1

## 2018-03-18 DIAGNOSIS — Z6825 Body mass index (BMI) 25.0-25.9, adult: Secondary | ICD-10-CM | POA: Diagnosis not present

## 2018-03-18 DIAGNOSIS — Z1212 Encounter for screening for malignant neoplasm of rectum: Secondary | ICD-10-CM | POA: Diagnosis not present

## 2018-03-18 DIAGNOSIS — Z01419 Encounter for gynecological examination (general) (routine) without abnormal findings: Secondary | ICD-10-CM | POA: Diagnosis not present

## 2018-04-06 MED FILL — LO LOESTRIN FE 1-10 TABLET: 1 MG-10 MCG | 28 days supply | Qty: 28 | Fill #2

## 2018-05-04 MED FILL — LO LOESTRIN FE 1-10 TABLET: 1 MG-10 MCG | 28 days supply | Qty: 28 | Fill #0

## 2018-06-01 MED FILL — LO LOESTRIN FE 1-10 TABLET: 1 MG-10 MCG | 28 days supply | Qty: 28 | Fill #1

## 2018-06-22 DIAGNOSIS — R82998 Other abnormal findings in urine: Secondary | ICD-10-CM | POA: Diagnosis not present

## 2018-06-22 DIAGNOSIS — E559 Vitamin D deficiency, unspecified: Secondary | ICD-10-CM | POA: Diagnosis not present

## 2018-06-22 DIAGNOSIS — E038 Other specified hypothyroidism: Secondary | ICD-10-CM | POA: Diagnosis not present

## 2018-06-22 DIAGNOSIS — Z0000 Encounter for general adult medical examination without abnormal findings: Secondary | ICD-10-CM | POA: Diagnosis not present

## 2018-06-26 MED FILL — LO LOESTRIN FE 1-10 TABLET: 1 MG-10 MCG | 28 days supply | Qty: 28 | Fill #2

## 2018-06-29 DIAGNOSIS — Z1389 Encounter for screening for other disorder: Secondary | ICD-10-CM | POA: Diagnosis not present

## 2018-06-29 DIAGNOSIS — E038 Other specified hypothyroidism: Secondary | ICD-10-CM | POA: Diagnosis not present

## 2018-06-29 DIAGNOSIS — Z0000 Encounter for general adult medical examination without abnormal findings: Secondary | ICD-10-CM | POA: Diagnosis not present

## 2018-06-29 DIAGNOSIS — Z6825 Body mass index (BMI) 25.0-25.9, adult: Secondary | ICD-10-CM | POA: Diagnosis not present

## 2018-06-29 DIAGNOSIS — E559 Vitamin D deficiency, unspecified: Secondary | ICD-10-CM | POA: Diagnosis not present

## 2018-07-02 ENCOUNTER — Other Ambulatory Visit: Payer: Self-pay | Admitting: Internal Medicine

## 2018-07-02 DIAGNOSIS — Z1231 Encounter for screening mammogram for malignant neoplasm of breast: Secondary | ICD-10-CM

## 2018-07-10 MED FILL — LEVOTHYROXINE 75 MCG TABLET: 75 | 90 days supply | Qty: 90 | Fill #0

## 2018-07-23 MED FILL — LO LOESTRIN FE 1-10 TABLET: 1 MG-10 MCG | 28 days supply | Qty: 28 | Fill #3

## 2018-08-24 MED FILL — LO LOESTRIN FE 1-10 TABLET: 1 MG-10 MCG | 28 days supply | Qty: 28 | Fill #4

## 2018-09-08 ENCOUNTER — Ambulatory Visit
Admission: RE | Admit: 2018-09-08 | Discharge: 2018-09-08 | Disposition: A | Discharge status: Home / Self Care | Payer: 59 | Source: Ambulatory Visit | Attending: Internal Medicine | Admitting: Internal Medicine

## 2018-09-08 DIAGNOSIS — Z1231 Encounter for screening mammogram for malignant neoplasm of breast: Secondary | ICD-10-CM

## 2018-09-21 MED FILL — LO LOESTRIN FE 1-10 TABLET: 1 MG-10 MCG | 28 days supply | Qty: 28 | Fill #5

## 2018-10-15 MED FILL — LO LOESTRIN FE 1-10 TABLET: 1 MG-10 MCG | 28 days supply | Qty: 28 | Fill #6

## 2018-11-03 MED FILL — LEVOTHYROXINE 75 MCG TABLET: 75 | 90 days supply | Qty: 90 | Fill #1

## 2018-11-07 MED FILL — LO LOESTRIN FE 1-10 TABLET: 1 MG-10 MCG | 28 days supply | Qty: 28 | Fill #7 | Status: TO

## 2018-11-10 DIAGNOSIS — M25562 Pain in left knee: Secondary | ICD-10-CM | POA: Diagnosis not present

## 2018-11-19 DIAGNOSIS — M25562 Pain in left knee: Secondary | ICD-10-CM | POA: Diagnosis not present

## 2018-12-07 MED FILL — LO LOESTRIN FE 1-10 TABLET: 1 MG-10 MCG | 28 days supply | Qty: 28 | Fill #0

## 2018-12-30 MED FILL — LO LOESTRIN FE 1-10 TABLET: 1 MG-10 MCG | 28 days supply | Qty: 28 | Fill #1

## 2019-01-06 DIAGNOSIS — M25562 Pain in left knee: Secondary | ICD-10-CM | POA: Diagnosis not present

## 2019-01-06 DIAGNOSIS — M7632 Iliotibial band syndrome, left leg: Secondary | ICD-10-CM | POA: Diagnosis not present

## 2019-01-06 DIAGNOSIS — S76311D Strain of muscle, fascia and tendon of the posterior muscle group at thigh level, right thigh, subsequent encounter: Secondary | ICD-10-CM | POA: Diagnosis not present

## 2019-02-02 MED FILL — LO LOESTRIN FE 1-10 TABLET: 1 MG-10 MCG | 28 days supply | Qty: 28 | Fill #0

## 2019-02-02 MED FILL — LEVOTHYROXINE 75 MCG TABLET: 75 | 90 days supply | Qty: 90 | Fill #2

## 2019-02-18 DIAGNOSIS — M9903 Segmental and somatic dysfunction of lumbar region: Secondary | ICD-10-CM | POA: Diagnosis not present

## 2019-02-18 DIAGNOSIS — M9905 Segmental and somatic dysfunction of pelvic region: Secondary | ICD-10-CM | POA: Diagnosis not present

## 2019-02-18 DIAGNOSIS — M545 Low back pain: Secondary | ICD-10-CM | POA: Diagnosis not present

## 2019-02-18 DIAGNOSIS — M9904 Segmental and somatic dysfunction of sacral region: Secondary | ICD-10-CM | POA: Diagnosis not present

## 2019-02-18 DIAGNOSIS — M7918 Myalgia, other site: Secondary | ICD-10-CM | POA: Diagnosis not present

## 2019-02-18 DIAGNOSIS — G5702 Lesion of sciatic nerve, left lower limb: Secondary | ICD-10-CM | POA: Diagnosis not present

## 2019-02-18 DIAGNOSIS — M79652 Pain in left thigh: Secondary | ICD-10-CM | POA: Diagnosis not present

## 2019-02-24 DIAGNOSIS — M545 Low back pain: Secondary | ICD-10-CM | POA: Diagnosis not present

## 2019-02-24 DIAGNOSIS — M79652 Pain in left thigh: Secondary | ICD-10-CM | POA: Diagnosis not present

## 2019-02-24 DIAGNOSIS — M9905 Segmental and somatic dysfunction of pelvic region: Secondary | ICD-10-CM | POA: Diagnosis not present

## 2019-02-24 DIAGNOSIS — G5702 Lesion of sciatic nerve, left lower limb: Secondary | ICD-10-CM | POA: Diagnosis not present

## 2019-02-24 DIAGNOSIS — M9903 Segmental and somatic dysfunction of lumbar region: Secondary | ICD-10-CM | POA: Diagnosis not present

## 2019-02-24 DIAGNOSIS — M9904 Segmental and somatic dysfunction of sacral region: Secondary | ICD-10-CM | POA: Diagnosis not present

## 2019-02-24 DIAGNOSIS — M7918 Myalgia, other site: Secondary | ICD-10-CM | POA: Diagnosis not present

## 2019-03-01 MED FILL — LO LOESTRIN FE 1-10 TABLET: 1 MG-10 MCG | 28 days supply | Qty: 28 | Fill #0

## 2019-03-04 DIAGNOSIS — M9905 Segmental and somatic dysfunction of pelvic region: Secondary | ICD-10-CM | POA: Diagnosis not present

## 2019-03-04 DIAGNOSIS — M79652 Pain in left thigh: Secondary | ICD-10-CM | POA: Diagnosis not present

## 2019-03-04 DIAGNOSIS — G5702 Lesion of sciatic nerve, left lower limb: Secondary | ICD-10-CM | POA: Diagnosis not present

## 2019-03-04 DIAGNOSIS — M9903 Segmental and somatic dysfunction of lumbar region: Secondary | ICD-10-CM | POA: Diagnosis not present

## 2019-03-04 DIAGNOSIS — M9904 Segmental and somatic dysfunction of sacral region: Secondary | ICD-10-CM | POA: Diagnosis not present

## 2019-03-04 DIAGNOSIS — M545 Low back pain: Secondary | ICD-10-CM | POA: Diagnosis not present

## 2019-03-04 DIAGNOSIS — M7918 Myalgia, other site: Secondary | ICD-10-CM | POA: Diagnosis not present

## 2019-03-09 DIAGNOSIS — M7918 Myalgia, other site: Secondary | ICD-10-CM | POA: Diagnosis not present

## 2019-03-09 DIAGNOSIS — G5702 Lesion of sciatic nerve, left lower limb: Secondary | ICD-10-CM | POA: Diagnosis not present

## 2019-03-09 DIAGNOSIS — M9904 Segmental and somatic dysfunction of sacral region: Secondary | ICD-10-CM | POA: Diagnosis not present

## 2019-03-09 DIAGNOSIS — M545 Low back pain: Secondary | ICD-10-CM | POA: Diagnosis not present

## 2019-03-09 DIAGNOSIS — M9903 Segmental and somatic dysfunction of lumbar region: Secondary | ICD-10-CM | POA: Diagnosis not present

## 2019-03-09 DIAGNOSIS — M79652 Pain in left thigh: Secondary | ICD-10-CM | POA: Diagnosis not present

## 2019-03-09 DIAGNOSIS — M9905 Segmental and somatic dysfunction of pelvic region: Secondary | ICD-10-CM | POA: Diagnosis not present

## 2019-03-17 DIAGNOSIS — G5702 Lesion of sciatic nerve, left lower limb: Secondary | ICD-10-CM | POA: Diagnosis not present

## 2019-03-17 DIAGNOSIS — M79652 Pain in left thigh: Secondary | ICD-10-CM | POA: Diagnosis not present

## 2019-03-17 DIAGNOSIS — M545 Low back pain: Secondary | ICD-10-CM | POA: Diagnosis not present

## 2019-03-17 DIAGNOSIS — M9903 Segmental and somatic dysfunction of lumbar region: Secondary | ICD-10-CM | POA: Diagnosis not present

## 2019-03-17 DIAGNOSIS — M9904 Segmental and somatic dysfunction of sacral region: Secondary | ICD-10-CM | POA: Diagnosis not present

## 2019-03-17 DIAGNOSIS — M9905 Segmental and somatic dysfunction of pelvic region: Secondary | ICD-10-CM | POA: Diagnosis not present

## 2019-03-17 DIAGNOSIS — M7918 Myalgia, other site: Secondary | ICD-10-CM | POA: Diagnosis not present

## 2019-03-29 DIAGNOSIS — M7918 Myalgia, other site: Secondary | ICD-10-CM | POA: Diagnosis not present

## 2019-03-29 DIAGNOSIS — M9903 Segmental and somatic dysfunction of lumbar region: Secondary | ICD-10-CM | POA: Diagnosis not present

## 2019-03-29 DIAGNOSIS — M79652 Pain in left thigh: Secondary | ICD-10-CM | POA: Diagnosis not present

## 2019-03-29 DIAGNOSIS — M545 Low back pain: Secondary | ICD-10-CM | POA: Diagnosis not present

## 2019-03-29 DIAGNOSIS — M9904 Segmental and somatic dysfunction of sacral region: Secondary | ICD-10-CM | POA: Diagnosis not present

## 2019-03-29 DIAGNOSIS — M9905 Segmental and somatic dysfunction of pelvic region: Secondary | ICD-10-CM | POA: Diagnosis not present

## 2019-03-29 DIAGNOSIS — G5702 Lesion of sciatic nerve, left lower limb: Secondary | ICD-10-CM | POA: Diagnosis not present

## 2019-04-02 MED FILL — LO LOESTRIN FE 1-10 TABLET: 1 MG-10 MCG | 28 days supply | Qty: 28 | Fill #0

## 2019-04-14 DIAGNOSIS — M9904 Segmental and somatic dysfunction of sacral region: Secondary | ICD-10-CM | POA: Diagnosis not present

## 2019-04-14 DIAGNOSIS — M9903 Segmental and somatic dysfunction of lumbar region: Secondary | ICD-10-CM | POA: Diagnosis not present

## 2019-04-14 DIAGNOSIS — M9905 Segmental and somatic dysfunction of pelvic region: Secondary | ICD-10-CM | POA: Diagnosis not present

## 2019-04-14 DIAGNOSIS — M79652 Pain in left thigh: Secondary | ICD-10-CM | POA: Diagnosis not present

## 2019-04-14 DIAGNOSIS — M7918 Myalgia, other site: Secondary | ICD-10-CM | POA: Diagnosis not present

## 2019-04-14 DIAGNOSIS — M545 Low back pain: Secondary | ICD-10-CM | POA: Diagnosis not present

## 2019-04-14 DIAGNOSIS — G5702 Lesion of sciatic nerve, left lower limb: Secondary | ICD-10-CM | POA: Diagnosis not present

## 2019-04-19 DIAGNOSIS — Z6827 Body mass index (BMI) 27.0-27.9, adult: Secondary | ICD-10-CM | POA: Diagnosis not present

## 2019-04-19 DIAGNOSIS — Z01419 Encounter for gynecological examination (general) (routine) without abnormal findings: Secondary | ICD-10-CM | POA: Diagnosis not present

## 2019-04-27 MED FILL — LEVOTHYROXINE 75 MCG TABLET: 75 | 90 days supply | Qty: 90 | Fill #3

## 2019-05-12 DIAGNOSIS — M545 Low back pain: Secondary | ICD-10-CM | POA: Diagnosis not present

## 2019-05-12 DIAGNOSIS — M9904 Segmental and somatic dysfunction of sacral region: Secondary | ICD-10-CM | POA: Diagnosis not present

## 2019-05-12 DIAGNOSIS — M9903 Segmental and somatic dysfunction of lumbar region: Secondary | ICD-10-CM | POA: Diagnosis not present

## 2019-05-12 DIAGNOSIS — G5702 Lesion of sciatic nerve, left lower limb: Secondary | ICD-10-CM | POA: Diagnosis not present

## 2019-05-12 DIAGNOSIS — M79652 Pain in left thigh: Secondary | ICD-10-CM | POA: Diagnosis not present

## 2019-05-12 DIAGNOSIS — M7632 Iliotibial band syndrome, left leg: Secondary | ICD-10-CM | POA: Diagnosis not present

## 2019-05-12 DIAGNOSIS — M7918 Myalgia, other site: Secondary | ICD-10-CM | POA: Diagnosis not present

## 2019-05-12 DIAGNOSIS — M9905 Segmental and somatic dysfunction of pelvic region: Secondary | ICD-10-CM | POA: Diagnosis not present

## 2019-05-28 MED FILL — LO LOESTRIN FE 1-10 TABLET: 1 MG-10 MCG | 28 days supply | Qty: 28 | Fill #0

## 2019-06-17 DIAGNOSIS — M7918 Myalgia, other site: Secondary | ICD-10-CM | POA: Diagnosis not present

## 2019-06-17 DIAGNOSIS — M9904 Segmental and somatic dysfunction of sacral region: Secondary | ICD-10-CM | POA: Diagnosis not present

## 2019-06-17 DIAGNOSIS — M545 Low back pain: Secondary | ICD-10-CM | POA: Diagnosis not present

## 2019-06-17 DIAGNOSIS — M79652 Pain in left thigh: Secondary | ICD-10-CM | POA: Diagnosis not present

## 2019-06-17 DIAGNOSIS — G5702 Lesion of sciatic nerve, left lower limb: Secondary | ICD-10-CM | POA: Diagnosis not present

## 2019-06-17 DIAGNOSIS — M7632 Iliotibial band syndrome, left leg: Secondary | ICD-10-CM | POA: Diagnosis not present

## 2019-06-17 DIAGNOSIS — M9905 Segmental and somatic dysfunction of pelvic region: Secondary | ICD-10-CM | POA: Diagnosis not present

## 2019-06-17 DIAGNOSIS — M9903 Segmental and somatic dysfunction of lumbar region: Secondary | ICD-10-CM | POA: Diagnosis not present

## 2019-06-25 MED FILL — LO LOESTRIN FE 1-10 TABLET: 1 MG-10 MCG | 28 days supply | Qty: 28 | Fill #1

## 2019-06-28 DIAGNOSIS — H5213 Myopia, bilateral: Secondary | ICD-10-CM | POA: Diagnosis not present

## 2019-06-28 DIAGNOSIS — E559 Vitamin D deficiency, unspecified: Secondary | ICD-10-CM | POA: Diagnosis not present

## 2019-06-28 DIAGNOSIS — E038 Other specified hypothyroidism: Secondary | ICD-10-CM | POA: Diagnosis not present

## 2019-06-28 DIAGNOSIS — R82998 Other abnormal findings in urine: Secondary | ICD-10-CM | POA: Diagnosis not present

## 2019-06-28 DIAGNOSIS — Z0000 Encounter for general adult medical examination without abnormal findings: Secondary | ICD-10-CM | POA: Diagnosis not present

## 2019-07-05 DIAGNOSIS — E559 Vitamin D deficiency, unspecified: Secondary | ICD-10-CM | POA: Diagnosis not present

## 2019-07-05 DIAGNOSIS — Z0000 Encounter for general adult medical examination without abnormal findings: Secondary | ICD-10-CM | POA: Diagnosis not present

## 2019-07-05 DIAGNOSIS — E039 Hypothyroidism, unspecified: Secondary | ICD-10-CM | POA: Diagnosis not present

## 2019-07-05 DIAGNOSIS — Z1331 Encounter for screening for depression: Secondary | ICD-10-CM | POA: Diagnosis not present

## 2019-07-26 MED FILL — LEVOTHYROXINE 75 MCG TABLET: 75 | 90 days supply | Qty: 90 | Fill #0

## 2019-08-23 MED FILL — LO LOESTRIN FE 1-10 TABLET: 1 MG-10 MCG | 28 days supply | Qty: 28 | Fill #2

## 2019-08-24 ENCOUNTER — Other Ambulatory Visit: Payer: Self-pay | Admitting: Internal Medicine

## 2019-08-24 DIAGNOSIS — Z9289 Personal history of other medical treatment: Secondary | ICD-10-CM

## 2019-09-21 MED FILL — LO LOESTRIN FE 1-10 TABLET: 1 MG-10 MCG | 28 days supply | Qty: 28 | Fill #3

## 2019-09-29 ENCOUNTER — Other Ambulatory Visit: Payer: Self-pay

## 2019-09-29 ENCOUNTER — Ambulatory Visit
Admission: RE | Admit: 2019-09-29 | Discharge: 2019-09-29 | Disposition: A | Discharge status: Home / Self Care | Payer: 59 | Source: Ambulatory Visit | Attending: Internal Medicine | Admitting: Internal Medicine

## 2019-09-29 DIAGNOSIS — Z1231 Encounter for screening mammogram for malignant neoplasm of breast: Secondary | ICD-10-CM | POA: Diagnosis not present

## 2019-09-29 DIAGNOSIS — Z9289 Personal history of other medical treatment: Secondary | ICD-10-CM

## 2019-10-18 MED FILL — LO LOESTRIN FE 1-10 TABLET: 1 MG-10 MCG | 28 days supply | Qty: 28 | Fill #4

## 2019-10-28 MED FILL — LEVOTHYROXINE SODIUM 75 MCG: 75 | 90 days supply | Qty: 90 | Fill #1

## 2019-11-01 DIAGNOSIS — S5002XA Contusion of left elbow, initial encounter: Secondary | ICD-10-CM | POA: Diagnosis not present

## 2019-11-15 MED FILL — LO LOESTRIN FE 1-10 TABLET: 1 MG-10 MCG | 28 days supply | Qty: 28 | Fill #5

## 2019-12-13 MED FILL — LO LOESTRIN FE 1-10 TABLET: 1 MG-10 MCG | 28 days supply | Qty: 28 | Fill #6

## 2020-01-10 MED FILL — LO LOESTRIN FE 1-10 TABLET: 1 MG-10 MCG | 28 days supply | Qty: 28 | Fill #7

## 2020-02-04 MED FILL — LO LOESTRIN FE 1-10 TABLET: 1 MG-10 MCG | 28 days supply | Qty: 28 | Fill #8

## 2020-02-29 MED FILL — LO LOESTRIN FE 1-10 TABLET: 1 MG-10 MCG | 28 days supply | Qty: 28 | Fill #9

## 2020-03-31 MED FILL — LO LOESTRIN FE 1-10 TABLET: 1 MG-10 MCG | 28 days supply | Qty: 28 | Fill #10

## 2020-04-17 MED FILL — LEVOTHYROXINE 75 MCG TABLET: 75 | 90 days supply | Qty: 90 | Fill #3

## 2020-05-01 MED FILL — LO LOESTRIN FE 1-10 TABLET: 1 MG-10 MCG | 28 days supply | Qty: 28 | Fill #0

## 2020-05-23 MED FILL — LO LOESTRIN FE 1-10 TABLET: 1 MG-10 MCG | 28 days supply | Qty: 28 | Fill #1

## 2020-06-20 ENCOUNTER — Other Ambulatory Visit (HOSPITAL_COMMUNITY): Payer: Self-pay | Admitting: Obstetrics and Gynecology

## 2020-06-20 DIAGNOSIS — Z6827 Body mass index (BMI) 27.0-27.9, adult: Secondary | ICD-10-CM | POA: Diagnosis not present

## 2020-06-20 DIAGNOSIS — Z01419 Encounter for gynecological examination (general) (routine) without abnormal findings: Secondary | ICD-10-CM | POA: Diagnosis not present

## 2020-06-20 MED FILL — LO LOESTRIN FE 1-10 TABLET: 1 MG-10 MCG | 28 days supply | Qty: 28 | Fill #0

## 2020-07-06 DIAGNOSIS — Z0000 Encounter for general adult medical examination without abnormal findings: Secondary | ICD-10-CM | POA: Diagnosis not present

## 2020-07-06 DIAGNOSIS — E559 Vitamin D deficiency, unspecified: Secondary | ICD-10-CM | POA: Diagnosis not present

## 2020-07-06 DIAGNOSIS — E039 Hypothyroidism, unspecified: Secondary | ICD-10-CM | POA: Diagnosis not present

## 2020-07-07 DIAGNOSIS — M7632 Iliotibial band syndrome, left leg: Secondary | ICD-10-CM | POA: Diagnosis not present

## 2020-07-07 DIAGNOSIS — M5442 Lumbago with sciatica, left side: Secondary | ICD-10-CM | POA: Diagnosis not present

## 2020-07-07 DIAGNOSIS — G5702 Lesion of sciatic nerve, left lower limb: Secondary | ICD-10-CM | POA: Diagnosis not present

## 2020-07-07 DIAGNOSIS — M9903 Segmental and somatic dysfunction of lumbar region: Secondary | ICD-10-CM | POA: Diagnosis not present

## 2020-07-07 DIAGNOSIS — M9904 Segmental and somatic dysfunction of sacral region: Secondary | ICD-10-CM | POA: Diagnosis not present

## 2020-07-07 DIAGNOSIS — M7918 Myalgia, other site: Secondary | ICD-10-CM | POA: Diagnosis not present

## 2020-07-07 DIAGNOSIS — M9905 Segmental and somatic dysfunction of pelvic region: Secondary | ICD-10-CM | POA: Diagnosis not present

## 2020-07-12 ENCOUNTER — Other Ambulatory Visit (HOSPITAL_COMMUNITY): Payer: Self-pay | Admitting: Internal Medicine

## 2020-07-12 DIAGNOSIS — R82998 Other abnormal findings in urine: Secondary | ICD-10-CM | POA: Diagnosis not present

## 2020-07-12 DIAGNOSIS — E039 Hypothyroidism, unspecified: Secondary | ICD-10-CM | POA: Diagnosis not present

## 2020-07-12 DIAGNOSIS — Z0000 Encounter for general adult medical examination without abnormal findings: Secondary | ICD-10-CM | POA: Diagnosis not present

## 2020-07-12 DIAGNOSIS — E559 Vitamin D deficiency, unspecified: Secondary | ICD-10-CM | POA: Diagnosis not present

## 2020-07-12 MED FILL — LEVOTHYROXINE 75 MCG TABLET: 75 | 90 days supply | Qty: 90 | Fill #0

## 2020-08-01 DIAGNOSIS — H5213 Myopia, bilateral: Secondary | ICD-10-CM | POA: Diagnosis not present

## 2020-09-18 ENCOUNTER — Other Ambulatory Visit: Payer: Self-pay | Admitting: Internal Medicine

## 2020-09-18 DIAGNOSIS — Z1231 Encounter for screening mammogram for malignant neoplasm of breast: Secondary | ICD-10-CM

## 2020-10-23 MED FILL — LEVOTHYROXINE 75 MCG TABLET: 75 | 90 days supply | Qty: 90 | Fill #1

## 2020-11-02 ENCOUNTER — Ambulatory Visit: Payer: 59

## 2020-12-21 ENCOUNTER — Other Ambulatory Visit: Payer: Self-pay

## 2020-12-21 ENCOUNTER — Ambulatory Visit
Admission: RE | Admit: 2020-12-21 | Discharge: 2020-12-21 | Disposition: A | Discharge status: Home / Self Care | Payer: 59 | Source: Ambulatory Visit | Attending: Internal Medicine | Admitting: Internal Medicine

## 2020-12-21 DIAGNOSIS — Z1231 Encounter for screening mammogram for malignant neoplasm of breast: Secondary | ICD-10-CM

## 2021-01-04 MED FILL — Norethin-Eth Estradiol-Fe Tab 1 MG-10 MCG (24)/10 MCG (2): ORAL | 28 days supply | Qty: 28 | Fill #0 | Status: AC

## 2021-01-04 MED FILL — Levothyroxine Sodium Tab 75 MCG: ORAL | 90 days supply | Qty: 90 | Fill #0 | Status: AC

## 2021-01-05 ENCOUNTER — Other Ambulatory Visit (HOSPITAL_COMMUNITY): Payer: Self-pay

## 2021-02-01 MED FILL — Norethin-Eth Estradiol-Fe Tab 1 MG-10 MCG (24)/10 MCG (2): ORAL | 28 days supply | Qty: 28 | Fill #1 | Status: AC

## 2021-02-02 ENCOUNTER — Other Ambulatory Visit (HOSPITAL_COMMUNITY): Payer: Self-pay

## 2021-03-02 ENCOUNTER — Other Ambulatory Visit (HOSPITAL_COMMUNITY): Payer: Self-pay

## 2021-03-02 MED FILL — Norethin-Eth Estradiol-Fe Tab 1 MG-10 MCG (24)/10 MCG (2): ORAL | 28 days supply | Qty: 28 | Fill #2 | Status: AC

## 2021-03-29 ENCOUNTER — Other Ambulatory Visit (HOSPITAL_COMMUNITY): Payer: Self-pay

## 2021-03-29 MED FILL — Norethin-Eth Estradiol-Fe Tab 1 MG-10 MCG (24)/10 MCG (2): ORAL | 28 days supply | Qty: 28 | Fill #3 | Status: AC

## 2021-04-16 MED FILL — Levothyroxine Sodium Tab 75 MCG: ORAL | 90 days supply | Qty: 90 | Fill #1 | Status: AC

## 2021-04-17 ENCOUNTER — Other Ambulatory Visit (HOSPITAL_COMMUNITY): Payer: Self-pay

## 2021-04-17 MED FILL — Norethin-Eth Estradiol-Fe Tab 1 MG-10 MCG (24)/10 MCG (2): ORAL | 28 days supply | Qty: 28 | Fill #4 | Status: AC

## 2021-04-20 ENCOUNTER — Other Ambulatory Visit (HOSPITAL_COMMUNITY): Payer: Self-pay

## 2021-05-07 IMAGING — MG MM DIGITAL SCREENING BILAT W/ TOMO AND CAD
8 series · 9 of 24 positions shown · non-contrast
Comparison: Previous exam(s).

CLINICAL DATA: Screening.

EXAM:
DIGITAL SCREENING BILATERAL MAMMOGRAM WITH TOMOSYNTHESIS AND CAD
TECHNIQUE: Bilateral screening digital craniocaudal and mediolateral oblique
mammograms were obtained. Bilateral screening digital breast
tomosynthesis was performed. The images were evaluated with
computer-aided detection.

[L MLO synth-2D]
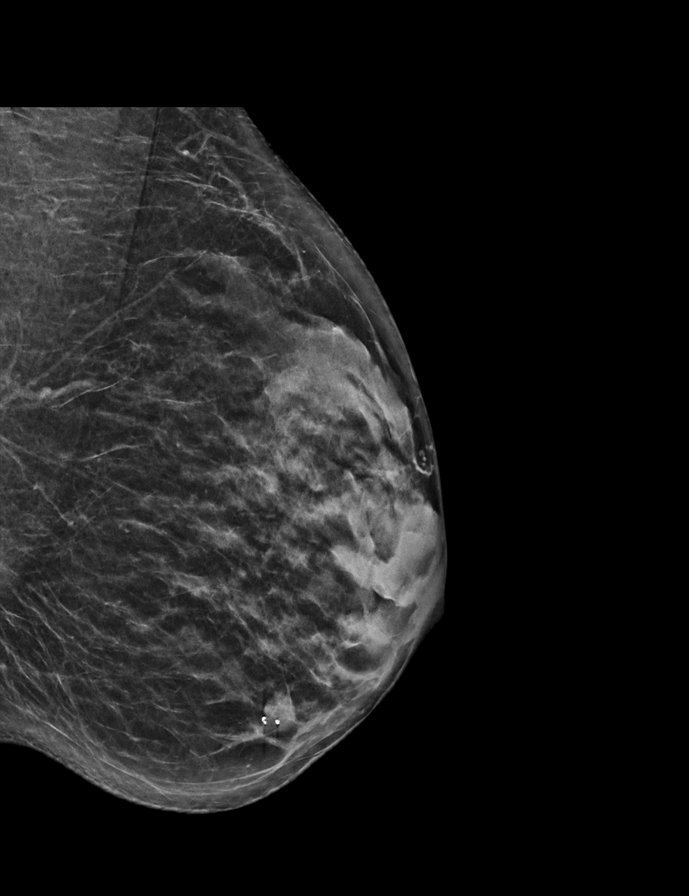

[L CC synth-2D]
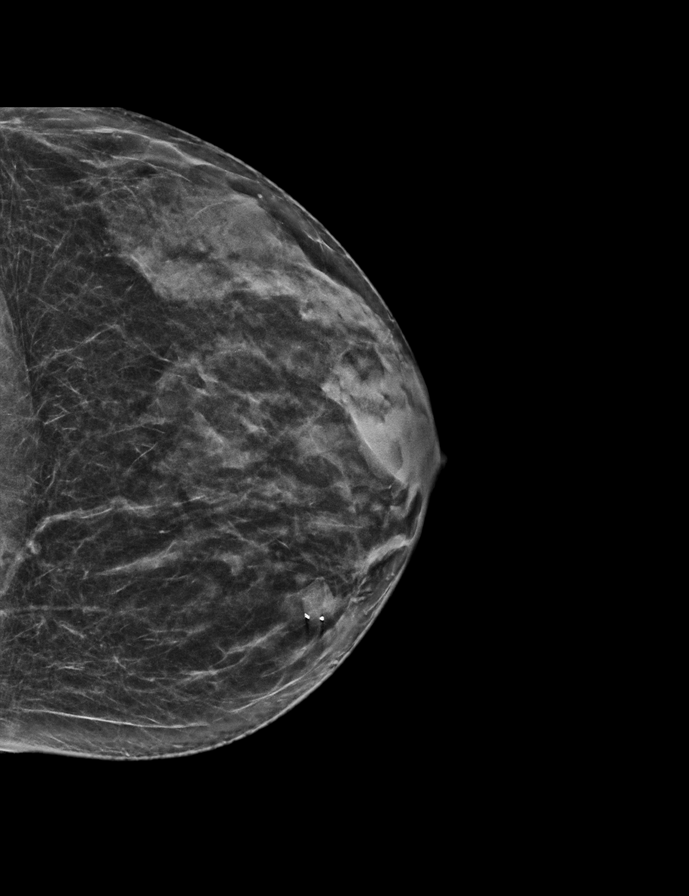

[R CC synth-2D]
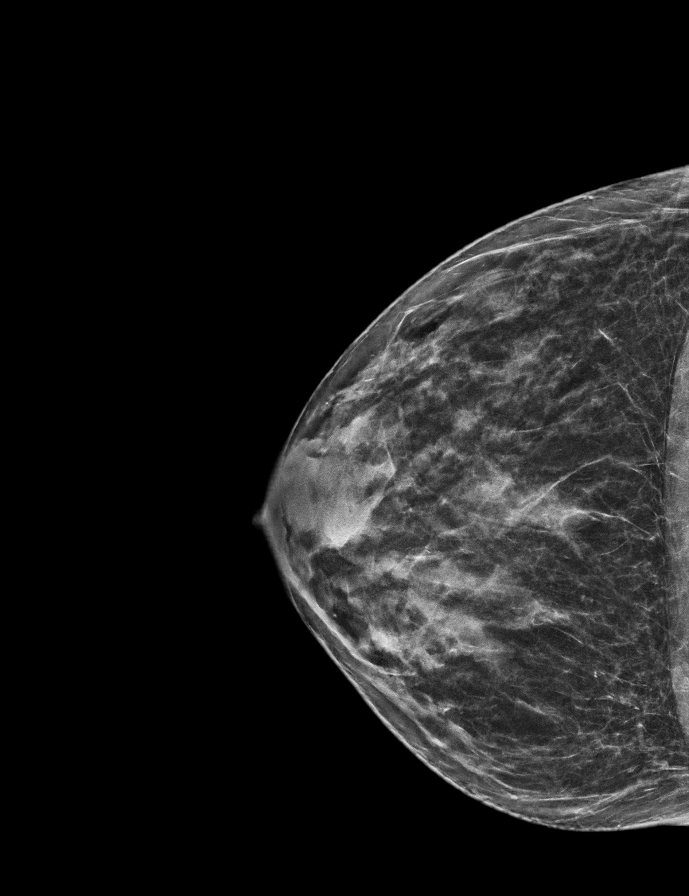

[R MLO synth-2D]
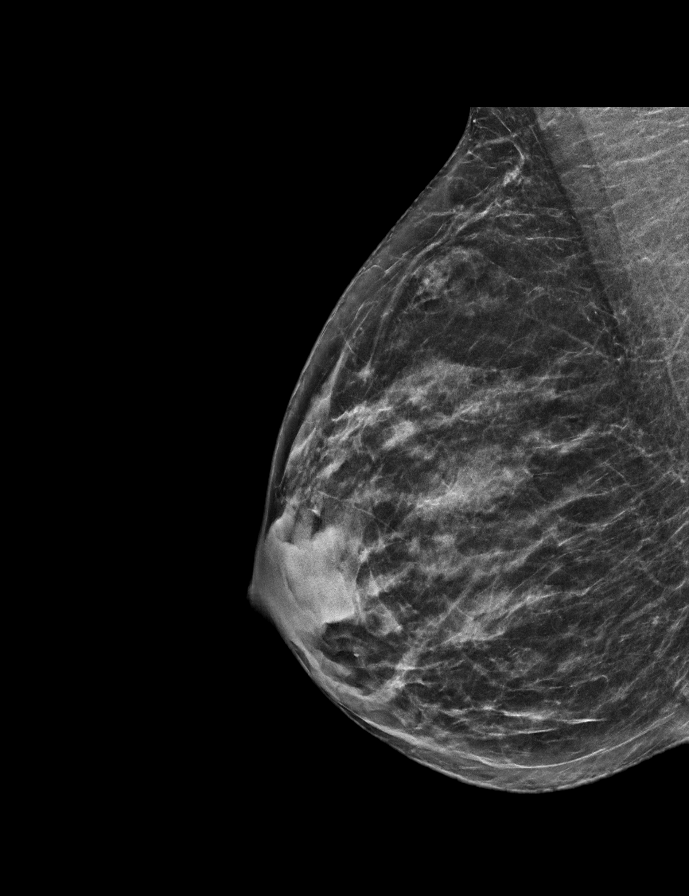

[R MLO tomo · 2 of 54 frames shown]
[frame 18/54]
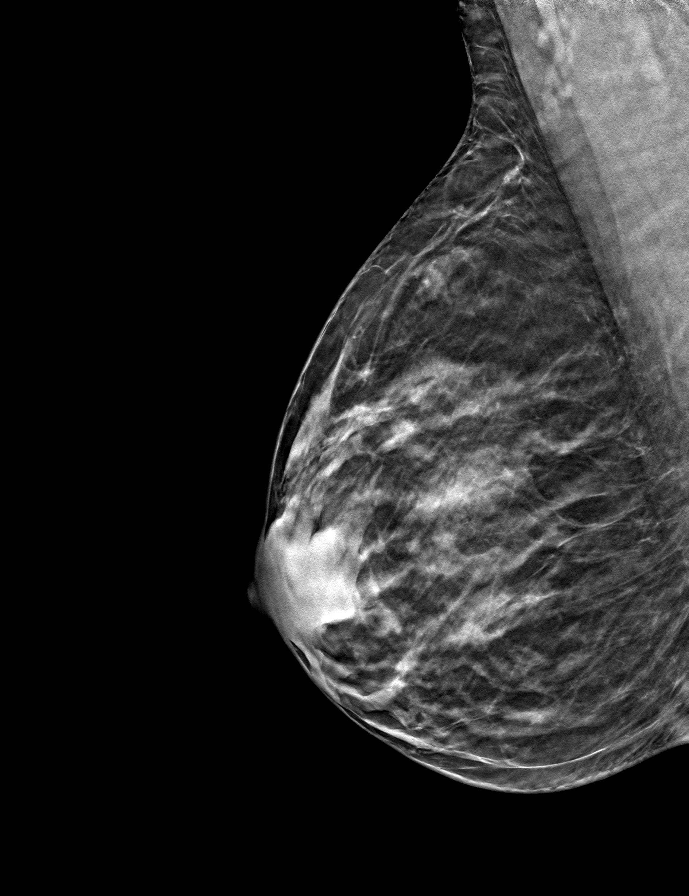
[frame 27/54]
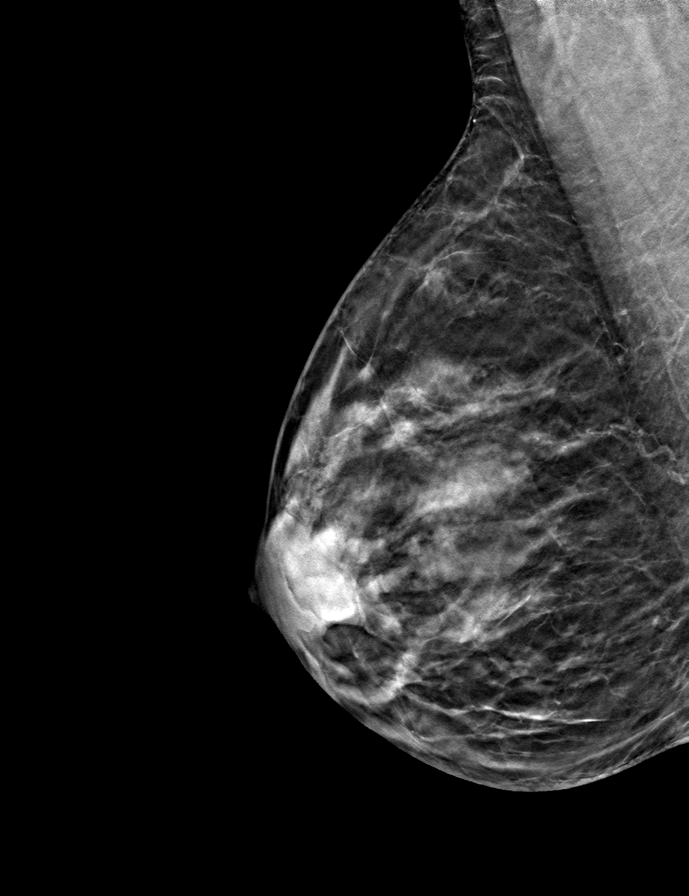

[R CC tomo · tomo slice 28/55.0]
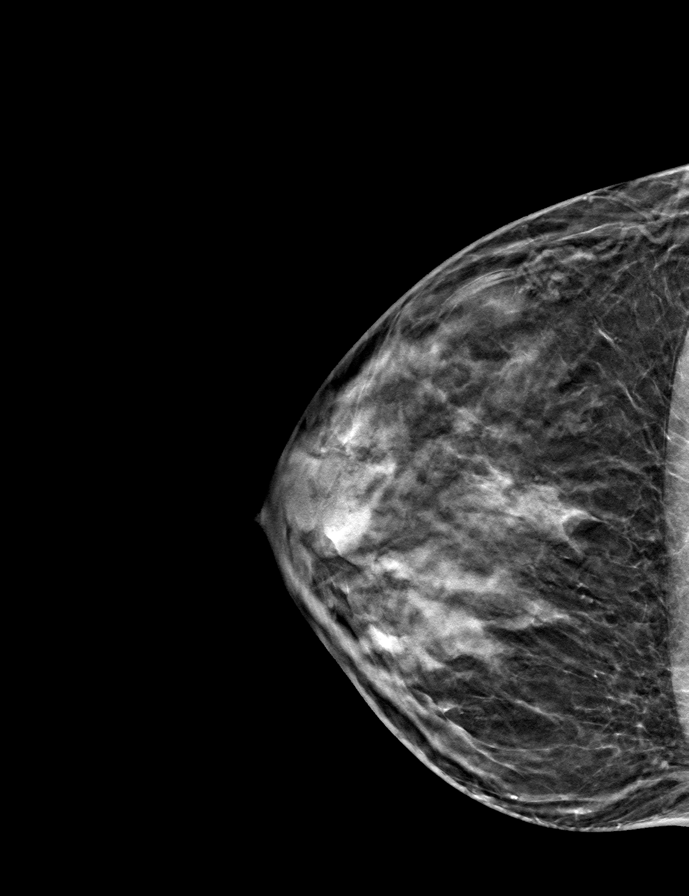

[L MLO tomo · tomo slice 29/57.0]
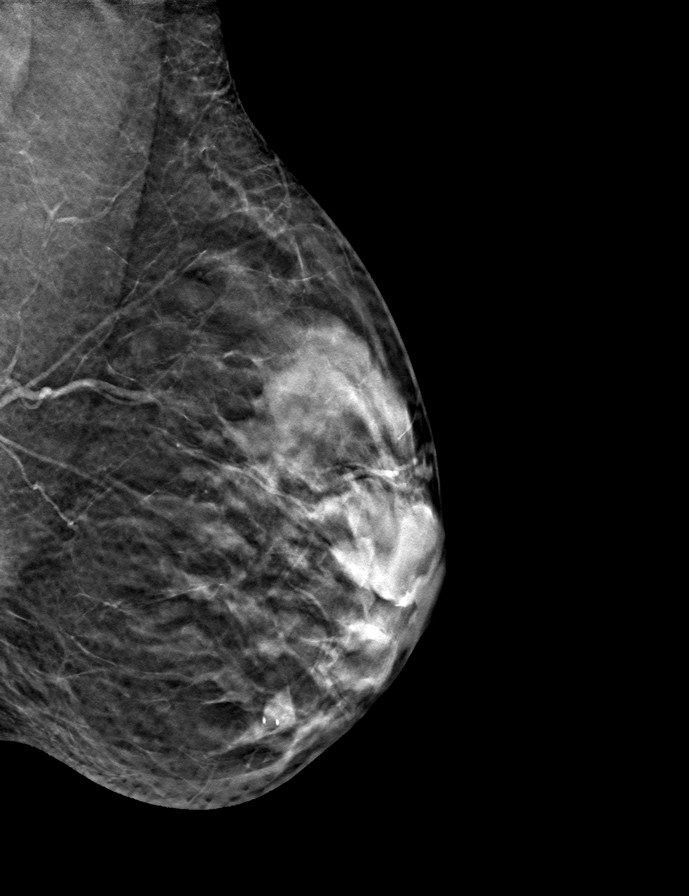

[L CC tomo · tomo slice 27/54.0]
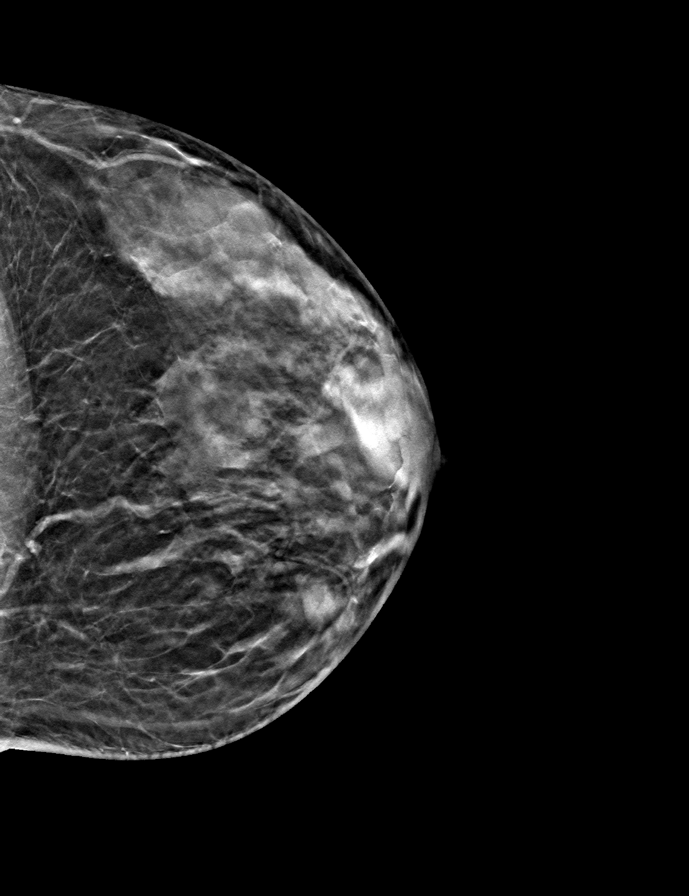

[9 of 24 positions shown; findings below may reference images not displayed]

ACR Breast Density Category c: The breast tissue is heterogeneously
dense, which may obscure small masses.
FINDINGS: There are no findings suspicious for malignancy. The images were
evaluated with computer-aided detection.
IMPRESSION: No mammographic evidence of malignancy. A result letter of this
screening mammogram will be mailed directly to the patient.

RECOMMENDATION:
Screening mammogram in one year. (Code:T4-5-GWO)

BI-RADS CATEGORY  1: Negative.

## 2021-05-20 MED FILL — Norethin-Eth Estradiol-Fe Tab 1 MG-10 MCG (24)/10 MCG (2): ORAL | 28 days supply | Qty: 28 | Fill #5 | Status: AC

## 2021-05-21 ENCOUNTER — Other Ambulatory Visit (HOSPITAL_COMMUNITY): Payer: Self-pay

## 2021-06-22 ENCOUNTER — Other Ambulatory Visit (HOSPITAL_COMMUNITY): Payer: Self-pay

## 2021-06-25 ENCOUNTER — Other Ambulatory Visit (HOSPITAL_COMMUNITY): Payer: Self-pay

## 2021-06-25 MED ORDER — LO LOESTRIN FE 1 MG-10 MCG / 10 MCG PO TABS
1.0000 | ORAL_TABLET | Freq: Every day | ORAL | 1 refills | Status: DC
  Filled 2021-06-25: qty 28, 28d supply, fill #0
  Filled 2021-07-18: qty 28, 28d supply, fill #1

## 2021-06-26 ENCOUNTER — Other Ambulatory Visit (HOSPITAL_COMMUNITY): Payer: Self-pay

## 2021-07-09 ENCOUNTER — Other Ambulatory Visit (HOSPITAL_COMMUNITY): Payer: Self-pay

## 2021-07-10 ENCOUNTER — Other Ambulatory Visit (HOSPITAL_COMMUNITY): Payer: Self-pay

## 2021-07-11 ENCOUNTER — Other Ambulatory Visit (HOSPITAL_COMMUNITY): Payer: Self-pay

## 2021-07-11 MED ORDER — LEVOTHYROXINE SODIUM 75 MCG PO TABS
75.0000 ug | ORAL_TABLET | Freq: Every day | ORAL | 3 refills | Status: DC
  Filled 2021-07-11: qty 90, 90d supply, fill #0
  Filled 2021-10-11: qty 90, 90d supply, fill #1
  Filled 2022-01-04: qty 90, 90d supply, fill #2
  Filled 2022-04-10: qty 90, 90d supply, fill #3

## 2021-07-18 ENCOUNTER — Other Ambulatory Visit (HOSPITAL_COMMUNITY): Payer: Self-pay

## 2021-07-19 DIAGNOSIS — E559 Vitamin D deficiency, unspecified: Secondary | ICD-10-CM | POA: Diagnosis not present

## 2021-07-19 DIAGNOSIS — E039 Hypothyroidism, unspecified: Secondary | ICD-10-CM | POA: Diagnosis not present

## 2021-08-01 ENCOUNTER — Other Ambulatory Visit (HOSPITAL_COMMUNITY): Payer: Self-pay

## 2021-08-01 DIAGNOSIS — Z01419 Encounter for gynecological examination (general) (routine) without abnormal findings: Secondary | ICD-10-CM | POA: Diagnosis not present

## 2021-08-01 DIAGNOSIS — Z760 Encounter for issue of repeat prescription: Secondary | ICD-10-CM | POA: Diagnosis not present

## 2021-08-01 DIAGNOSIS — Z6825 Body mass index (BMI) 25.0-25.9, adult: Secondary | ICD-10-CM | POA: Diagnosis not present

## 2021-08-01 MED ORDER — LO LOESTRIN FE 1 MG-10 MCG / 10 MCG PO TABS
1.0000 | ORAL_TABLET | Freq: Every day | ORAL | 11 refills | Status: DC
  Filled 2021-08-01 – 2021-10-11 (×2): qty 28, 28d supply, fill #0
  Filled 2021-12-07: qty 28, 28d supply, fill #1
  Filled 2022-01-04: qty 28, 28d supply, fill #2
  Filled 2022-01-31: qty 28, 28d supply, fill #3
  Filled 2022-03-01: qty 28, 28d supply, fill #4
  Filled 2022-03-28: qty 28, 28d supply, fill #5
  Filled 2022-04-23: qty 28, 28d supply, fill #6
  Filled 2022-05-21: qty 28, 28d supply, fill #7
  Filled 2022-06-20: qty 28, 28d supply, fill #8
  Filled 2022-07-17: qty 28, 28d supply, fill #9

## 2021-08-03 ENCOUNTER — Other Ambulatory Visit (HOSPITAL_COMMUNITY): Payer: Self-pay

## 2021-08-03 DIAGNOSIS — E039 Hypothyroidism, unspecified: Secondary | ICD-10-CM | POA: Diagnosis not present

## 2021-08-03 DIAGNOSIS — Z0000 Encounter for general adult medical examination without abnormal findings: Secondary | ICD-10-CM | POA: Diagnosis not present

## 2021-08-03 DIAGNOSIS — E559 Vitamin D deficiency, unspecified: Secondary | ICD-10-CM | POA: Diagnosis not present

## 2021-08-03 DIAGNOSIS — Z1331 Encounter for screening for depression: Secondary | ICD-10-CM | POA: Diagnosis not present

## 2021-08-03 DIAGNOSIS — Z1339 Encounter for screening examination for other mental health and behavioral disorders: Secondary | ICD-10-CM | POA: Diagnosis not present

## 2021-08-03 MED ORDER — LEVOTHYROXINE SODIUM 75 MCG PO TABS
75.0000 ug | ORAL_TABLET | Freq: Every day | ORAL | 3 refills | Status: AC
  Filled 2021-08-03: qty 90, 90d supply, fill #0

## 2021-10-11 ENCOUNTER — Other Ambulatory Visit (HOSPITAL_COMMUNITY): Payer: Self-pay

## 2021-12-07 ENCOUNTER — Other Ambulatory Visit (HOSPITAL_COMMUNITY): Payer: Self-pay

## 2022-01-02 ENCOUNTER — Encounter: Payer: Self-pay | Admitting: Internal Medicine

## 2022-01-04 ENCOUNTER — Other Ambulatory Visit (HOSPITAL_COMMUNITY): Payer: Self-pay

## 2022-01-31 ENCOUNTER — Other Ambulatory Visit: Payer: Self-pay | Admitting: Internal Medicine

## 2022-01-31 ENCOUNTER — Other Ambulatory Visit (HOSPITAL_COMMUNITY): Payer: Self-pay

## 2022-01-31 DIAGNOSIS — Z1231 Encounter for screening mammogram for malignant neoplasm of breast: Secondary | ICD-10-CM

## 2022-02-06 ENCOUNTER — Ambulatory Visit: Payer: 59

## 2022-02-13 ENCOUNTER — Ambulatory Visit
Admission: RE | Admit: 2022-02-13 | Discharge: 2022-02-13 | Disposition: A | Discharge status: Home / Self Care | Payer: 59 | Source: Ambulatory Visit | Attending: Internal Medicine | Admitting: Internal Medicine

## 2022-02-13 DIAGNOSIS — Z1231 Encounter for screening mammogram for malignant neoplasm of breast: Secondary | ICD-10-CM

## 2022-03-01 ENCOUNTER — Other Ambulatory Visit (HOSPITAL_COMMUNITY): Payer: Self-pay

## 2022-03-28 ENCOUNTER — Other Ambulatory Visit (HOSPITAL_COMMUNITY): Payer: Self-pay

## 2022-04-10 ENCOUNTER — Other Ambulatory Visit (HOSPITAL_COMMUNITY): Payer: Self-pay

## 2022-04-23 ENCOUNTER — Other Ambulatory Visit (HOSPITAL_COMMUNITY): Payer: Self-pay

## 2022-05-21 ENCOUNTER — Other Ambulatory Visit (HOSPITAL_COMMUNITY): Payer: Self-pay

## 2022-06-20 ENCOUNTER — Other Ambulatory Visit (HOSPITAL_COMMUNITY): Payer: Self-pay

## 2022-06-20 MED ORDER — LEVOTHYROXINE SODIUM 75 MCG PO TABS
75.0000 ug | ORAL_TABLET | Freq: Every day | ORAL | 3 refills | Status: AC
  Filled 2022-07-04: qty 90, 90d supply, fill #0

## 2022-07-05 ENCOUNTER — Other Ambulatory Visit (HOSPITAL_COMMUNITY): Payer: Self-pay

## 2022-07-18 ENCOUNTER — Other Ambulatory Visit (HOSPITAL_COMMUNITY): Payer: Self-pay

## 2022-08-08 ENCOUNTER — Other Ambulatory Visit (HOSPITAL_COMMUNITY): Payer: Self-pay

## 2022-08-08 MED ORDER — LO LOESTRIN FE 1 MG-10 MCG / 10 MCG PO TABS
1.0000 | ORAL_TABLET | Freq: Every day | ORAL | 2 refills | Status: AC
  Filled 2022-08-08: qty 28, 28d supply, fill #0
  Filled 2022-09-12: qty 28, 28d supply, fill #1
  Filled 2022-10-10: qty 28, 28d supply, fill #2

## 2022-08-09 ENCOUNTER — Other Ambulatory Visit (HOSPITAL_COMMUNITY): Payer: Self-pay

## 2022-08-23 DIAGNOSIS — R002 Palpitations: Secondary | ICD-10-CM | POA: Diagnosis not present

## 2022-08-23 DIAGNOSIS — E039 Hypothyroidism, unspecified: Secondary | ICD-10-CM | POA: Diagnosis not present

## 2022-08-23 DIAGNOSIS — R7989 Other specified abnormal findings of blood chemistry: Secondary | ICD-10-CM | POA: Diagnosis not present

## 2022-08-23 DIAGNOSIS — E559 Vitamin D deficiency, unspecified: Secondary | ICD-10-CM | POA: Diagnosis not present

## 2022-08-30 ENCOUNTER — Other Ambulatory Visit (HOSPITAL_COMMUNITY): Payer: Self-pay

## 2022-08-30 MED ORDER — LEVOTHYROXINE SODIUM 100 MCG PO TABS
100.0000 ug | ORAL_TABLET | Freq: Every morning | ORAL | 3 refills | Status: DC
  Filled 2022-08-30: qty 90, 90d supply, fill #0
  Filled 2022-11-24: qty 90, 90d supply, fill #1
  Filled 2023-02-27: qty 90, 90d supply, fill #2
  Filled 2023-05-21: qty 90, 90d supply, fill #3

## 2022-09-05 ENCOUNTER — Ambulatory Visit: Payer: Commercial Managed Care - PPO | Attending: Internal Medicine

## 2022-09-05 ENCOUNTER — Other Ambulatory Visit (HOSPITAL_COMMUNITY): Payer: Self-pay

## 2022-09-05 ENCOUNTER — Other Ambulatory Visit: Payer: Self-pay | Admitting: *Deleted

## 2022-09-05 DIAGNOSIS — R002 Palpitations: Secondary | ICD-10-CM

## 2022-09-05 NOTE — Progress Notes (Unsigned)
Enrolled for [employer] to mail a ZIO XT long term holter monitor to the patients address on file.   DOD to read. 

## 2022-09-10 DIAGNOSIS — R002 Palpitations: Secondary | ICD-10-CM

## 2022-09-24 ENCOUNTER — Other Ambulatory Visit (HOSPITAL_COMMUNITY): Payer: Self-pay

## 2022-09-24 DIAGNOSIS — Z01419 Encounter for gynecological examination (general) (routine) without abnormal findings: Secondary | ICD-10-CM | POA: Diagnosis not present

## 2022-09-24 DIAGNOSIS — Z760 Encounter for issue of repeat prescription: Secondary | ICD-10-CM | POA: Diagnosis not present

## 2022-09-24 DIAGNOSIS — Z6826 Body mass index (BMI) 26.0-26.9, adult: Secondary | ICD-10-CM | POA: Diagnosis not present

## 2022-09-24 MED ORDER — LO LOESTRIN FE 1 MG-10 MCG / 10 MCG PO TABS
1.0000 | ORAL_TABLET | Freq: Every day | ORAL | 11 refills | Status: DC
  Filled 2022-09-24 – 2022-11-06 (×2): qty 28, 28d supply, fill #0
  Filled 2022-12-09: qty 28, 28d supply, fill #1
  Filled 2023-01-01: qty 28, 28d supply, fill #2
  Filled 2023-01-30: qty 28, 28d supply, fill #3
  Filled 2023-02-27: qty 28, 28d supply, fill #4
  Filled 2023-03-12 – 2023-03-24 (×2): qty 28, 28d supply, fill #5
  Filled 2023-04-22: qty 28, 28d supply, fill #6
  Filled 2023-05-21: qty 28, 28d supply, fill #7
  Filled 2023-06-16: qty 28, 28d supply, fill #8
  Filled 2023-07-14: qty 28, 28d supply, fill #9
  Filled 2023-08-11: qty 28, 28d supply, fill #10
  Filled 2023-09-08: qty 28, 28d supply, fill #11

## 2022-10-11 ENCOUNTER — Other Ambulatory Visit (HOSPITAL_COMMUNITY): Payer: Self-pay

## 2022-11-06 ENCOUNTER — Other Ambulatory Visit (HOSPITAL_COMMUNITY): Payer: Self-pay

## 2022-11-06 DIAGNOSIS — E039 Hypothyroidism, unspecified: Secondary | ICD-10-CM | POA: Diagnosis not present

## 2022-11-26 ENCOUNTER — Other Ambulatory Visit (HOSPITAL_COMMUNITY): Payer: Self-pay

## 2023-02-21 ENCOUNTER — Other Ambulatory Visit: Payer: Self-pay

## 2023-02-21 MED ORDER — PEG-3350/ELECTROLYTES 236 G PO SOLR
ORAL | 0 refills | Status: AC
  Filled 2023-02-21: qty 4000, 1d supply, fill #0

## 2023-02-21 MED ORDER — BISACODYL 5 MG PO TBEC
20.0000 mg | DELAYED_RELEASE_TABLET | ORAL | 0 refills | Status: AC
  Filled 2023-02-21: qty 10, 1d supply, fill #0

## 2023-02-25 DIAGNOSIS — Z1211 Encounter for screening for malignant neoplasm of colon: Secondary | ICD-10-CM | POA: Diagnosis not present

## 2023-03-12 ENCOUNTER — Other Ambulatory Visit (HOSPITAL_COMMUNITY): Payer: Self-pay

## 2023-03-17 ENCOUNTER — Other Ambulatory Visit: Payer: Self-pay | Admitting: Internal Medicine

## 2023-03-17 DIAGNOSIS — Z1231 Encounter for screening mammogram for malignant neoplasm of breast: Secondary | ICD-10-CM

## 2023-03-21 ENCOUNTER — Ambulatory Visit: Admission: RE | Admit: 2023-03-21 | Payer: 59 | Source: Ambulatory Visit

## 2023-03-21 DIAGNOSIS — Z1231 Encounter for screening mammogram for malignant neoplasm of breast: Secondary | ICD-10-CM | POA: Diagnosis not present

## 2023-06-11 DIAGNOSIS — M9905 Segmental and somatic dysfunction of pelvic region: Secondary | ICD-10-CM | POA: Diagnosis not present

## 2023-06-11 DIAGNOSIS — M9904 Segmental and somatic dysfunction of sacral region: Secondary | ICD-10-CM | POA: Diagnosis not present

## 2023-06-11 DIAGNOSIS — M9903 Segmental and somatic dysfunction of lumbar region: Secondary | ICD-10-CM | POA: Diagnosis not present

## 2023-06-11 DIAGNOSIS — M545 Low back pain, unspecified: Secondary | ICD-10-CM | POA: Diagnosis not present

## 2023-06-11 DIAGNOSIS — M79662 Pain in left lower leg: Secondary | ICD-10-CM | POA: Diagnosis not present

## 2023-06-11 DIAGNOSIS — M7918 Myalgia, other site: Secondary | ICD-10-CM | POA: Diagnosis not present

## 2023-06-11 DIAGNOSIS — M5416 Radiculopathy, lumbar region: Secondary | ICD-10-CM | POA: Diagnosis not present

## 2023-06-11 DIAGNOSIS — S76312A Strain of muscle, fascia and tendon of the posterior muscle group at thigh level, left thigh, initial encounter: Secondary | ICD-10-CM | POA: Diagnosis not present

## 2023-06-25 DIAGNOSIS — H52223 Regular astigmatism, bilateral: Secondary | ICD-10-CM | POA: Diagnosis not present

## 2023-06-25 DIAGNOSIS — H524 Presbyopia: Secondary | ICD-10-CM | POA: Diagnosis not present

## 2023-06-26 DIAGNOSIS — S76312A Strain of muscle, fascia and tendon of the posterior muscle group at thigh level, left thigh, initial encounter: Secondary | ICD-10-CM | POA: Diagnosis not present

## 2023-06-26 DIAGNOSIS — M79662 Pain in left lower leg: Secondary | ICD-10-CM | POA: Diagnosis not present

## 2023-06-26 DIAGNOSIS — M9903 Segmental and somatic dysfunction of lumbar region: Secondary | ICD-10-CM | POA: Diagnosis not present

## 2023-06-26 DIAGNOSIS — M5416 Radiculopathy, lumbar region: Secondary | ICD-10-CM | POA: Diagnosis not present

## 2023-06-26 DIAGNOSIS — M545 Low back pain, unspecified: Secondary | ICD-10-CM | POA: Diagnosis not present

## 2023-06-26 DIAGNOSIS — M7918 Myalgia, other site: Secondary | ICD-10-CM | POA: Diagnosis not present

## 2023-06-26 DIAGNOSIS — M9904 Segmental and somatic dysfunction of sacral region: Secondary | ICD-10-CM | POA: Diagnosis not present

## 2023-06-26 DIAGNOSIS — M9905 Segmental and somatic dysfunction of pelvic region: Secondary | ICD-10-CM | POA: Diagnosis not present

## 2023-07-02 DIAGNOSIS — M9903 Segmental and somatic dysfunction of lumbar region: Secondary | ICD-10-CM | POA: Diagnosis not present

## 2023-07-02 DIAGNOSIS — M9905 Segmental and somatic dysfunction of pelvic region: Secondary | ICD-10-CM | POA: Diagnosis not present

## 2023-07-02 DIAGNOSIS — M5416 Radiculopathy, lumbar region: Secondary | ICD-10-CM | POA: Diagnosis not present

## 2023-07-02 DIAGNOSIS — S76312A Strain of muscle, fascia and tendon of the posterior muscle group at thigh level, left thigh, initial encounter: Secondary | ICD-10-CM | POA: Diagnosis not present

## 2023-07-02 DIAGNOSIS — M79662 Pain in left lower leg: Secondary | ICD-10-CM | POA: Diagnosis not present

## 2023-07-02 DIAGNOSIS — M9904 Segmental and somatic dysfunction of sacral region: Secondary | ICD-10-CM | POA: Diagnosis not present

## 2023-07-02 DIAGNOSIS — M545 Low back pain, unspecified: Secondary | ICD-10-CM | POA: Diagnosis not present

## 2023-07-02 DIAGNOSIS — M7918 Myalgia, other site: Secondary | ICD-10-CM | POA: Diagnosis not present

## 2023-07-10 DIAGNOSIS — M9904 Segmental and somatic dysfunction of sacral region: Secondary | ICD-10-CM | POA: Diagnosis not present

## 2023-07-10 DIAGNOSIS — M7918 Myalgia, other site: Secondary | ICD-10-CM | POA: Diagnosis not present

## 2023-07-10 DIAGNOSIS — M545 Low back pain, unspecified: Secondary | ICD-10-CM | POA: Diagnosis not present

## 2023-07-10 DIAGNOSIS — M9903 Segmental and somatic dysfunction of lumbar region: Secondary | ICD-10-CM | POA: Diagnosis not present

## 2023-07-10 DIAGNOSIS — M5416 Radiculopathy, lumbar region: Secondary | ICD-10-CM | POA: Diagnosis not present

## 2023-07-10 DIAGNOSIS — S76312A Strain of muscle, fascia and tendon of the posterior muscle group at thigh level, left thigh, initial encounter: Secondary | ICD-10-CM | POA: Diagnosis not present

## 2023-07-10 DIAGNOSIS — M9905 Segmental and somatic dysfunction of pelvic region: Secondary | ICD-10-CM | POA: Diagnosis not present

## 2023-07-10 DIAGNOSIS — M79662 Pain in left lower leg: Secondary | ICD-10-CM | POA: Diagnosis not present

## 2023-07-21 DIAGNOSIS — S76312A Strain of muscle, fascia and tendon of the posterior muscle group at thigh level, left thigh, initial encounter: Secondary | ICD-10-CM | POA: Diagnosis not present

## 2023-07-21 DIAGNOSIS — M5416 Radiculopathy, lumbar region: Secondary | ICD-10-CM | POA: Diagnosis not present

## 2023-07-21 DIAGNOSIS — M545 Low back pain, unspecified: Secondary | ICD-10-CM | POA: Diagnosis not present

## 2023-07-21 DIAGNOSIS — M9903 Segmental and somatic dysfunction of lumbar region: Secondary | ICD-10-CM | POA: Diagnosis not present

## 2023-07-21 DIAGNOSIS — M9904 Segmental and somatic dysfunction of sacral region: Secondary | ICD-10-CM | POA: Diagnosis not present

## 2023-07-21 DIAGNOSIS — M79662 Pain in left lower leg: Secondary | ICD-10-CM | POA: Diagnosis not present

## 2023-07-21 DIAGNOSIS — M9905 Segmental and somatic dysfunction of pelvic region: Secondary | ICD-10-CM | POA: Diagnosis not present

## 2023-07-21 DIAGNOSIS — M7918 Myalgia, other site: Secondary | ICD-10-CM | POA: Diagnosis not present

## 2023-07-29 DIAGNOSIS — M9903 Segmental and somatic dysfunction of lumbar region: Secondary | ICD-10-CM | POA: Diagnosis not present

## 2023-07-29 DIAGNOSIS — M5416 Radiculopathy, lumbar region: Secondary | ICD-10-CM | POA: Diagnosis not present

## 2023-07-29 DIAGNOSIS — M9905 Segmental and somatic dysfunction of pelvic region: Secondary | ICD-10-CM | POA: Diagnosis not present

## 2023-07-29 DIAGNOSIS — S76312A Strain of muscle, fascia and tendon of the posterior muscle group at thigh level, left thigh, initial encounter: Secondary | ICD-10-CM | POA: Diagnosis not present

## 2023-07-29 DIAGNOSIS — M7918 Myalgia, other site: Secondary | ICD-10-CM | POA: Diagnosis not present

## 2023-07-29 DIAGNOSIS — M545 Low back pain, unspecified: Secondary | ICD-10-CM | POA: Diagnosis not present

## 2023-07-29 DIAGNOSIS — M9904 Segmental and somatic dysfunction of sacral region: Secondary | ICD-10-CM | POA: Diagnosis not present

## 2023-07-29 DIAGNOSIS — M79662 Pain in left lower leg: Secondary | ICD-10-CM | POA: Diagnosis not present

## 2023-08-06 DIAGNOSIS — M79662 Pain in left lower leg: Secondary | ICD-10-CM | POA: Diagnosis not present

## 2023-08-06 DIAGNOSIS — M7918 Myalgia, other site: Secondary | ICD-10-CM | POA: Diagnosis not present

## 2023-08-06 DIAGNOSIS — M9905 Segmental and somatic dysfunction of pelvic region: Secondary | ICD-10-CM | POA: Diagnosis not present

## 2023-08-06 DIAGNOSIS — M545 Low back pain, unspecified: Secondary | ICD-10-CM | POA: Diagnosis not present

## 2023-08-06 DIAGNOSIS — S76312A Strain of muscle, fascia and tendon of the posterior muscle group at thigh level, left thigh, initial encounter: Secondary | ICD-10-CM | POA: Diagnosis not present

## 2023-08-06 DIAGNOSIS — M9904 Segmental and somatic dysfunction of sacral region: Secondary | ICD-10-CM | POA: Diagnosis not present

## 2023-08-06 DIAGNOSIS — M9903 Segmental and somatic dysfunction of lumbar region: Secondary | ICD-10-CM | POA: Diagnosis not present

## 2023-08-06 DIAGNOSIS — M5416 Radiculopathy, lumbar region: Secondary | ICD-10-CM | POA: Diagnosis not present

## 2023-08-07 ENCOUNTER — Ambulatory Visit
Admission: RE | Admit: 2023-08-07 | Discharge: 2023-08-07 | Disposition: A | Discharge status: Home / Self Care | Payer: 59 | Source: Ambulatory Visit | Attending: Chiropractic Medicine | Admitting: Chiropractic Medicine

## 2023-08-07 ENCOUNTER — Other Ambulatory Visit: Payer: Self-pay | Admitting: Chiropractic Medicine

## 2023-08-07 DIAGNOSIS — M545 Low back pain, unspecified: Secondary | ICD-10-CM

## 2023-08-07 DIAGNOSIS — M25551 Pain in right hip: Secondary | ICD-10-CM | POA: Diagnosis not present

## 2023-08-07 DIAGNOSIS — M5416 Radiculopathy, lumbar region: Secondary | ICD-10-CM | POA: Diagnosis not present

## 2023-08-19 ENCOUNTER — Other Ambulatory Visit (HOSPITAL_COMMUNITY): Payer: Self-pay

## 2023-08-19 DIAGNOSIS — M5416 Radiculopathy, lumbar region: Secondary | ICD-10-CM | POA: Diagnosis not present

## 2023-08-19 DIAGNOSIS — M9905 Segmental and somatic dysfunction of pelvic region: Secondary | ICD-10-CM | POA: Diagnosis not present

## 2023-08-19 DIAGNOSIS — S76312A Strain of muscle, fascia and tendon of the posterior muscle group at thigh level, left thigh, initial encounter: Secondary | ICD-10-CM | POA: Diagnosis not present

## 2023-08-19 DIAGNOSIS — M7918 Myalgia, other site: Secondary | ICD-10-CM | POA: Diagnosis not present

## 2023-08-19 DIAGNOSIS — M9904 Segmental and somatic dysfunction of sacral region: Secondary | ICD-10-CM | POA: Diagnosis not present

## 2023-08-19 DIAGNOSIS — M79662 Pain in left lower leg: Secondary | ICD-10-CM | POA: Diagnosis not present

## 2023-08-19 DIAGNOSIS — M545 Low back pain, unspecified: Secondary | ICD-10-CM | POA: Diagnosis not present

## 2023-08-19 DIAGNOSIS — M9903 Segmental and somatic dysfunction of lumbar region: Secondary | ICD-10-CM | POA: Diagnosis not present

## 2023-08-19 MED ORDER — LEVOTHYROXINE SODIUM 100 MCG PO TABS
100.0000 ug | ORAL_TABLET | Freq: Every morning | ORAL | 3 refills | Status: DC
  Filled 2023-08-19: qty 90, 90d supply, fill #0
  Filled 2023-11-16: qty 90, 90d supply, fill #1
  Filled 2024-02-16: qty 90, 90d supply, fill #2
  Filled 2024-05-11: qty 90, 90d supply, fill #3

## 2023-08-27 DIAGNOSIS — R002 Palpitations: Secondary | ICD-10-CM | POA: Diagnosis not present

## 2023-08-27 DIAGNOSIS — E559 Vitamin D deficiency, unspecified: Secondary | ICD-10-CM | POA: Diagnosis not present

## 2023-08-27 DIAGNOSIS — E039 Hypothyroidism, unspecified: Secondary | ICD-10-CM | POA: Diagnosis not present

## 2023-08-27 DIAGNOSIS — Z0000 Encounter for general adult medical examination without abnormal findings: Secondary | ICD-10-CM | POA: Diagnosis not present

## 2023-09-02 DIAGNOSIS — E559 Vitamin D deficiency, unspecified: Secondary | ICD-10-CM | POA: Diagnosis not present

## 2023-09-02 DIAGNOSIS — E039 Hypothyroidism, unspecified: Secondary | ICD-10-CM | POA: Diagnosis not present

## 2023-09-02 DIAGNOSIS — Z1331 Encounter for screening for depression: Secondary | ICD-10-CM | POA: Diagnosis not present

## 2023-09-02 DIAGNOSIS — Z0000 Encounter for general adult medical examination without abnormal findings: Secondary | ICD-10-CM | POA: Diagnosis not present

## 2023-09-02 DIAGNOSIS — Z1339 Encounter for screening examination for other mental health and behavioral disorders: Secondary | ICD-10-CM | POA: Diagnosis not present

## 2023-09-02 DIAGNOSIS — Z23 Encounter for immunization: Secondary | ICD-10-CM | POA: Diagnosis not present

## 2023-09-02 DIAGNOSIS — R002 Palpitations: Secondary | ICD-10-CM | POA: Diagnosis not present

## 2023-10-02 ENCOUNTER — Other Ambulatory Visit (HOSPITAL_COMMUNITY): Payer: Self-pay

## 2023-10-02 DIAGNOSIS — Z6824 Body mass index (BMI) 24.0-24.9, adult: Secondary | ICD-10-CM | POA: Diagnosis not present

## 2023-10-02 DIAGNOSIS — Z01419 Encounter for gynecological examination (general) (routine) without abnormal findings: Secondary | ICD-10-CM | POA: Diagnosis not present

## 2023-10-02 DIAGNOSIS — Z124 Encounter for screening for malignant neoplasm of cervix: Secondary | ICD-10-CM | POA: Diagnosis not present

## 2023-10-02 DIAGNOSIS — Z760 Encounter for issue of repeat prescription: Secondary | ICD-10-CM | POA: Diagnosis not present

## 2023-10-02 MED ORDER — LO LOESTRIN FE 1 MG-10 MCG / 10 MCG PO TABS
1.0000 | ORAL_TABLET | Freq: Every day | ORAL | 11 refills | Status: DC
  Filled 2023-10-02: qty 28, 28d supply, fill #0
  Filled 2023-11-03: qty 28, 28d supply, fill #1
  Filled 2023-11-27: qty 28, 28d supply, fill #2
  Filled 2023-12-25: qty 28, 28d supply, fill #3
  Filled 2024-01-29: qty 28, 28d supply, fill #4
  Filled 2024-02-22: qty 28, 28d supply, fill #5
  Filled 2024-03-21: qty 28, 28d supply, fill #6
  Filled 2024-04-20: qty 28, 28d supply, fill #7
  Filled 2024-05-20: qty 28, 28d supply, fill #8
  Filled 2024-06-13: qty 28, 28d supply, fill #9
  Filled 2024-07-11: qty 28, 28d supply, fill #10
  Filled 2024-07-27 – 2024-08-02 (×2): qty 28, 28d supply, fill #11

## 2023-12-25 DIAGNOSIS — M25572 Pain in left ankle and joints of left foot: Secondary | ICD-10-CM | POA: Diagnosis not present

## 2024-01-14 DIAGNOSIS — M25572 Pain in left ankle and joints of left foot: Secondary | ICD-10-CM | POA: Diagnosis not present

## 2024-01-29 ENCOUNTER — Other Ambulatory Visit (HOSPITAL_COMMUNITY): Payer: Self-pay

## 2024-02-23 ENCOUNTER — Other Ambulatory Visit (HOSPITAL_COMMUNITY): Payer: Self-pay

## 2024-04-22 ENCOUNTER — Other Ambulatory Visit (HOSPITAL_COMMUNITY): Payer: Self-pay

## 2024-05-28 ENCOUNTER — Other Ambulatory Visit: Payer: Self-pay | Admitting: Internal Medicine

## 2024-05-28 DIAGNOSIS — Z1231 Encounter for screening mammogram for malignant neoplasm of breast: Secondary | ICD-10-CM

## 2024-06-10 ENCOUNTER — Ambulatory Visit
Admission: RE | Admit: 2024-06-10 | Discharge: 2024-06-10 | Disposition: A | Discharge status: Home / Self Care | Source: Ambulatory Visit | Attending: Internal Medicine | Admitting: Internal Medicine

## 2024-06-10 DIAGNOSIS — Z1231 Encounter for screening mammogram for malignant neoplasm of breast: Secondary | ICD-10-CM

## 2024-07-27 ENCOUNTER — Other Ambulatory Visit (HOSPITAL_COMMUNITY): Payer: Self-pay

## 2024-07-28 ENCOUNTER — Other Ambulatory Visit: Payer: Self-pay

## 2024-07-28 ENCOUNTER — Other Ambulatory Visit (HOSPITAL_COMMUNITY): Payer: Self-pay

## 2024-07-28 MED ORDER — LEVOTHYROXINE SODIUM 100 MCG PO TABS
100.0000 ug | ORAL_TABLET | Freq: Every morning | ORAL | 3 refills | Status: AC
  Filled 2024-07-28 – 2024-08-04 (×2): qty 90, 90d supply, fill #0

## 2024-07-29 ENCOUNTER — Other Ambulatory Visit (HOSPITAL_COMMUNITY): Payer: Self-pay

## 2024-07-29 MED ORDER — LEVOTHYROXINE SODIUM 100 MCG PO TABS
100.0000 ug | ORAL_TABLET | ORAL | 3 refills | Status: AC
  Filled 2024-07-29 – 2024-08-02 (×2): qty 90, 90d supply, fill #0

## 2024-07-30 ENCOUNTER — Other Ambulatory Visit (HOSPITAL_COMMUNITY): Payer: Self-pay

## 2024-08-02 ENCOUNTER — Other Ambulatory Visit (HOSPITAL_COMMUNITY): Payer: Self-pay

## 2024-08-02 MED ORDER — LO LOESTRIN FE 1 MG-10 MCG / 10 MCG PO TABS
1.0000 | ORAL_TABLET | Freq: Every day | ORAL | 0 refills | Status: AC
  Filled 2024-08-02 – 2024-09-01 (×2): qty 84, 84d supply, fill #0

## 2024-08-03 ENCOUNTER — Other Ambulatory Visit (HOSPITAL_COMMUNITY): Payer: Self-pay

## 2024-08-03 ENCOUNTER — Other Ambulatory Visit: Payer: Self-pay

## 2024-08-04 ENCOUNTER — Other Ambulatory Visit: Payer: Self-pay

## 2024-08-04 ENCOUNTER — Other Ambulatory Visit (HOSPITAL_COMMUNITY): Payer: Self-pay

## 2024-08-17 ENCOUNTER — Other Ambulatory Visit (HOSPITAL_COMMUNITY): Payer: Self-pay

## 2024-08-17 MED ORDER — LEVOTHYROXINE SODIUM 100 MCG PO TABS
100.0000 ug | ORAL_TABLET | Freq: Every day | ORAL | 3 refills | Status: AC
  Filled 2024-08-17 – 2024-08-20 (×2): qty 90, 90d supply, fill #0

## 2024-08-20 ENCOUNTER — Other Ambulatory Visit (HOSPITAL_COMMUNITY): Payer: Self-pay

## 2024-09-01 ENCOUNTER — Other Ambulatory Visit (HOSPITAL_COMMUNITY): Payer: Self-pay
